# Patient Record
Sex: Male | Born: 1945 | Race: White | Hispanic: No | Marital: Married | Smoking: Former smoker
Health system: Southern US, Community
[De-identification: ages and names within clinical notes are randomized; demographics above are authoritative.]

## PROBLEM LIST (undated history)

## (undated) DIAGNOSIS — N189 Chronic kidney disease, unspecified: Secondary | ICD-10-CM

## (undated) DIAGNOSIS — E119 Type 2 diabetes mellitus without complications: Secondary | ICD-10-CM

## (undated) DIAGNOSIS — J90 Pleural effusion, not elsewhere classified: Secondary | ICD-10-CM

## (undated) DIAGNOSIS — I509 Heart failure, unspecified: Secondary | ICD-10-CM

## (undated) DIAGNOSIS — E669 Obesity, unspecified: Secondary | ICD-10-CM

## (undated) HISTORY — DX: Pleural effusion, not elsewhere classified: J90

## (undated) HISTORY — DX: Chronic kidney disease, unspecified: N18.9

## (undated) HISTORY — DX: Obesity, unspecified: E66.9

## (undated) HISTORY — PX: THORACENTESIS: SHX235

## (undated) HISTORY — DX: Heart failure, unspecified: I50.9

## (undated) HISTORY — DX: Type 2 diabetes mellitus without complications: E11.9

## (undated) HISTORY — PX: CORONARY ARTERY BYPASS GRAFT: SHX141

---

## 2009-05-03 ENCOUNTER — Encounter: Admission: RE | Admit: 2009-05-03 | Discharge: 2009-05-03 | Payer: Self-pay | Admitting: Internal Medicine

## 2009-09-24 ENCOUNTER — Encounter: Admission: RE | Admit: 2009-09-24 | Discharge: 2009-09-24 | Payer: Self-pay | Admitting: Internal Medicine

## 2010-06-18 ENCOUNTER — Other Ambulatory Visit: Payer: Self-pay | Admitting: Internal Medicine

## 2010-06-18 ENCOUNTER — Ambulatory Visit
Admission: RE | Admit: 2010-06-18 | Discharge: 2010-06-18 | Disposition: A | Discharge status: Home / Self Care | Payer: Medicare Other | Source: Ambulatory Visit | Attending: Internal Medicine | Admitting: Internal Medicine

## 2010-06-18 DIAGNOSIS — R05 Cough: Secondary | ICD-10-CM

## 2010-06-18 DIAGNOSIS — R059 Cough, unspecified: Secondary | ICD-10-CM

## 2011-05-05 ENCOUNTER — Encounter: Payer: Self-pay | Admitting: Cardiothoracic Surgery

## 2011-05-06 ENCOUNTER — Ambulatory Visit (INDEPENDENT_AMBULATORY_CARE_PROVIDER_SITE_OTHER): Payer: Commercial Indemnity | Admitting: Cardiothoracic Surgery

## 2011-05-06 DIAGNOSIS — J90 Pleural effusion, not elsewhere classified: Secondary | ICD-10-CM

## 2011-05-06 DIAGNOSIS — I509 Heart failure, unspecified: Secondary | ICD-10-CM

## 2011-05-06 DIAGNOSIS — E119 Type 2 diabetes mellitus without complications: Secondary | ICD-10-CM

## 2011-05-06 DIAGNOSIS — Z951 Presence of aortocoronary bypass graft: Secondary | ICD-10-CM

## 2011-05-06 NOTE — Progress Notes (Signed)
PCP is No primary provider on file. Referring Provider is Bethanie Dicker, MD                                                                     [address]            [place] [address]          [phone]      No chief complaint on file.   HPI: The patient is a 66 year old obese diabetic nonsmoker with a recurrent large right pleural effusion. He is required to thoracentesis procedures since February of this year each returning 2 L of clear fluid. Cytology is negative for malignancy and cultures are negative. Cytology shows mesenchymal cells and inflammation. The patient is referred for evaluation of a talc pleurodesis and Pleurx catheter placement. Is felt that the patient's recurrent right pleural effusion secondary to cor pulmonale and a diagnosis of sleep apnea. The patient is not able to tolerate a CPAP mask. His last echocardiogram in 6 months ago showed moderate portal hypertension, mild TR, mild MR, EF of 57%. 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 A circumflex graft was occluded to a small atretic native vessel by cardiac cath October 2012.  The patient's last thoracentesis was 6 days ago he can already feel that the fluid is returning and is becoming dyspneic with exertion. He denies any recent trauma to the chest. He denies any blood thinners other than aspirin. He denies any recent symptoms of upper SPARC or infection. There is no history of cirrhosis or heavy alcohol intake. He is followed by Dr. Heron Nay for his heart failure and has a pending evaluation by Dr. Chales Abrahams for a possible pacemaker for chronic bradycardia.   The patient was recently admitted to this hospital in February after an apneic near respiratory arrest during colonoscopy at which time the large right pleural effusion was noted and drained for the first time. CT scan of the chest shows no pulmonary mass or pathologic  mediastinal adenopathy. Past Medical History  Diagnosis Date  . Unspecified pleural effusion   . Obesity, unspecified   . Type II or unspecified type diabetes mellitus without mention of complication, not stated as uncontrolled   . CHF (congestive heart failure)     Past Surgical History  Procedure Date  . Coronary artery bypass graft     No family history on file.  Social History History  Substance Use Topics  . Smoking status: Former Smoker    Quit date: 08/20/2010  . Smokeless tobacco: Not on file  . Alcohol Use: Yes     OCCASIONAL   retired from the trucking industry 2009  Current Outpatient Prescriptions  Medication Sig Dispense Refill  . Aspirin (ECOTRIN PO) Take 325 mg by mouth daily.      Marland Kitchen atorvastatin (LIPITOR) 10 MG tablet Take 10 mg by mouth daily.      Marland Kitchen Bioflavonoid Products (ESTER-C) 500-550 MG TABS Take by mouth daily.      . FUROSEMIDE PO 20MG  TABLET, 1 ORAL AT 2:00PM DAILY 40MG  TABLET, 1 ORAL DAILY      . METOPROLOL TARTRATE PO Take 50 mg by  mouth 2 (two) times daily.      . multivitamin (METANX) 3-35-2 MG TABS tablet Take 1 tablet by mouth daily.        No Known Allergies  Review of Systems Constitutional fatigue with exertion no fever or night sweats HEENT no change in vision no dental complaints no difficulty swallowing Thorax recurrent right pleural effusion is noted no chest trauma positive for sleep apnea unable to tolerate CPAP mask Cardiac history CABG times 04/06/1997 last echo shows EF greater than 50% mild TR moderate pulmonary hypertension Abdominal negative for jaundice gallstones ulcer disease positive for hepatitis at age 36 now resolved Endocrine positive for diabetes mellitus treated with a insulin pump since 2009 Vascular no DVT claudication TIA Neurologic negative her stroke or seizure  There were no vitals taken for this visit. Physical Exam Blood pressure 128/70 pulse 50 temperature 90.5 saturation 93% weight 271 pounds height  72 inches Gen. Caucasian anxious obese male accompanied by wife HEENT normocephalic pupils equal dentition good Neck without JVD mass or bruit Lymphatics without palpable adenopathy the neck or supraclavicular fossa Cardiac regular rhythm without murmur well-healed sternal incision Pulmonary diminished breath sounds right base Abdomen soft obese nontender Extremities palpable radial pulses 3+ tibial ankle edema nonpalpable pulses do to edema no skin breakdown Neurologic alert without focal motor deficit  Diagnostic Tests: CT scan last cath last 2-D echo reviewed with the results noted above  Impression: Recurrent right pleural effusion secondary to right heart failure from probable cor pulmonale and sleep apnea compounded by ischemic heart disease and chronic bradycardia. No evidence of chronic pulmonary emboli by CTA.  Plan:Right VATS drainage of effusion, talc pleurodesed is in place and the Pleurx catheter scheduled for April 19. Procedure indications risks and alternatives discussed the patient wife and he understands and agrees to proceed. While he is in the hospital we'll asked Dr. Chales Abrahams 2 consult for evaluation of pacemaker.

## 2011-05-06 NOTE — Patient Instructions (Signed)
Nothing by mouth the night before surgery Do not take Q. her usual morning medications the morning of surgery Reduce the insulin pump to one half dose evening before surgery

## 2011-05-08 DIAGNOSIS — J90 Pleural effusion, not elsewhere classified: Secondary | ICD-10-CM

## 2011-05-20 ENCOUNTER — Ambulatory Visit (INDEPENDENT_AMBULATORY_CARE_PROVIDER_SITE_OTHER): Payer: Self-pay | Admitting: Cardiothoracic Surgery

## 2011-05-20 DIAGNOSIS — J90 Pleural effusion, not elsewhere classified: Secondary | ICD-10-CM

## 2011-05-20 DIAGNOSIS — I2789 Other specified pulmonary heart diseases: Secondary | ICD-10-CM

## 2011-05-20 DIAGNOSIS — I272 Pulmonary hypertension, unspecified: Secondary | ICD-10-CM

## 2011-05-20 DIAGNOSIS — Z951 Presence of aortocoronary bypass graft: Secondary | ICD-10-CM

## 2011-05-20 NOTE — Progress Notes (Signed)
PCP is No primary provider on file. Referring Provider is Bethanie Dicker, MD  No chief complaint on file.   HPI: Postop 10 days right vats and Pleurx catheter placement for recurrent right pleural effusion status post CABG 8 months ago, history of pulmonary hypertension         Doing well at home on home oxygen minimal Pleurx drainage by home health nurse 48 hours ago no fever productive cough weight has decreased 10 pounds from preop baseline ankle edema present   Past Medical History  Diagnosis Date  . Unspecified pleural effusion   . Obesity, unspecified   . Type II or unspecified type diabetes mellitus without mention of complication, not stated as uncontrolled   . CHF (congestive heart failure)     Past Surgical History  Procedure Date  . Coronary artery bypass graft     No family history on file.  Social History History  Substance Use Topics  . Smoking status: Former Smoker    Quit date: 08/20/2010  . Smokeless tobacco: Not on file  . Alcohol Use: Yes     OCCASIONAL    Current Outpatient Prescriptions  Medication Sig Dispense Refill  . Aspirin (ECOTRIN PO) Take 325 mg by mouth daily.      Marland Kitchen atorvastatin (LIPITOR) 10 MG tablet Take 10 mg by mouth daily.      Marland Kitchen Bioflavonoid Products (ESTER-C) 500-550 MG TABS Take by mouth daily.      . FUROSEMIDE PO 20MG  TABLET, 1 ORAL AT 2:00PM DAILY 40MG  TABLET, 1 ORAL DAILY      . METOPROLOL TARTRATE PO Take 50 mg by mouth 2 (two) times daily.      . multivitamin (METANX) 3-35-2 MG TABS tablet Take 1 tablet by mouth daily.        No Known Allergies  Review of Systems no fever drainage or bleeding, Pleurx catheter site clean and dry, home health nurse attending to Pleurx catheter  There were no vitals taken for this visit. Physical Exam Blood pressure 100/55 pulse 60 regular Breath sounds clear bilaterally VATS incisions clean and dry Pleurx catheter site clean and dry  Diagnostic Tests: Chest x-ray minimal right  pleural effusion, Pleurx catheter in good position  Impression: Status post right vats tall pleurodesis with good results so far Pleurx drain in the office today of 75 cc  Plan: Return in one week with chest x-ray to drain Pleurx catheter, hope to remove the Pleurx catheter near in future if pleural effusion does not recurr          Keflex 500 mg 3 times a day started for redness around old chest tube site         Home health nurse to drain Pleurx catheter on Monday, May 6

## 2011-05-27 ENCOUNTER — Ambulatory Visit (INDEPENDENT_AMBULATORY_CARE_PROVIDER_SITE_OTHER): Payer: Commercial Indemnity | Admitting: Cardiothoracic Surgery

## 2011-05-27 DIAGNOSIS — J90 Pleural effusion, not elsewhere classified: Secondary | ICD-10-CM

## 2011-05-27 DIAGNOSIS — Z9889 Other specified postprocedural states: Secondary | ICD-10-CM

## 2011-05-27 NOTE — Progress Notes (Signed)
PCP is No primary provider on file. Referring Provider is No ref. provider found  No chief complaint on file.   HPI: The patient returns for followup after undergoing right pleurodesed is with talc for recurrent benign pleural effusion. He also at the same time a Pleurx catheter placed. Since discharge from the hospital he has had minimal Pleurx drainage. His breathing is improved. The incisions are healing. His chest x-ray today shows mild right pleural effusion. He was having up to 2 L drain every 3 days.   Past Medical History  Diagnosis Date  . Unspecified pleural effusion   . Obesity, unspecified   . Type II or unspecified type diabetes mellitus without mention of complication, not stated as uncontrolled   . CHF (congestive heart failure)     Past Surgical History  Procedure Date  . Coronary artery bypass graft     No family history on file.  Social History History  Substance Use Topics  . Smoking status: Former Smoker    Quit date: 08/20/2010  . Smokeless tobacco: Not on file  . Alcohol Use: Yes     OCCASIONAL    Current Outpatient Prescriptions  Medication Sig Dispense Refill  . Aspirin (ECOTRIN PO) Take 325 mg by mouth daily.      Marland Kitchen atorvastatin (LIPITOR) 10 MG tablet Take 10 mg by mouth daily.      Marland Kitchen Bioflavonoid Products (ESTER-C) 500-550 MG TABS Take by mouth daily.      . FUROSEMIDE PO 20MG  TABLET, 1 ORAL AT 2:00PM DAILY 40MG  TABLET, 1 ORAL DAILY      . METOPROLOL TARTRATE PO Take 50 mg by mouth 2 (two) times daily.      . multivitamin (METANX) 3-35-2 MG TABS tablet Take 1 tablet by mouth daily.        No Known Allergies  Review of Systems negative  There were no vitals taken for this visit. Physical Exam Afebrile blood pressure stable pulse 90 saturation 95% Lungs clear  telescopic incisions clean and dry Pleurx catheter drained 10 cc  Diagnostic Tests:  chest x-ray with minimal right pleural effusion   Impression: Effective talc pleurodesed is  of right pleural space with minimal recurrent right pleural effusion following procedure. Plan on leaving th  Pleurx catheter in for 2 weeks with followup chest x-ray in the office in of no change plan on removing Pleurx catheter.

## 2011-06-10 ENCOUNTER — Ambulatory Visit (INDEPENDENT_AMBULATORY_CARE_PROVIDER_SITE_OTHER): Payer: Self-pay | Admitting: Cardiothoracic Surgery

## 2011-06-10 ENCOUNTER — Encounter (HOSPITAL_COMMUNITY): Payer: Self-pay | Admitting: Pharmacy Technician

## 2011-06-10 ENCOUNTER — Encounter: Payer: Self-pay | Admitting: Cardiothoracic Surgery

## 2011-06-10 ENCOUNTER — Other Ambulatory Visit: Payer: Self-pay | Admitting: Cardiothoracic Surgery

## 2011-06-10 ENCOUNTER — Ambulatory Visit
Admission: RE | Admit: 2011-06-10 | Discharge: 2011-06-10 | Disposition: A | Discharge status: Home / Self Care | Payer: Managed Care, Other (non HMO) | Source: Ambulatory Visit | Attending: Cardiothoracic Surgery | Admitting: Cardiothoracic Surgery

## 2011-06-10 VITALS — BP 128/80 | HR 96 | Resp 18 | Wt 254.0 lb

## 2011-06-10 DIAGNOSIS — J90 Pleural effusion, not elsewhere classified: Secondary | ICD-10-CM

## 2011-06-10 DIAGNOSIS — I509 Heart failure, unspecified: Secondary | ICD-10-CM

## 2011-06-10 DIAGNOSIS — Z09 Encounter for follow-up examination after completed treatment for conditions other than malignant neoplasm: Secondary | ICD-10-CM

## 2011-06-10 NOTE — Progress Notes (Signed)
PCP is Ralene Ok, MD, MD Referring Provider is Bethanie Dicker, MD  Chief Complaint  Patient presents with  . Routine Post Op    2 week F/U with CXR, Rt PleurX cath                     [address]            [place] [address]          [phone]      HPI: The patient is a 66 year old gentleman with right-sided heart failure from pulmonary hypertension with recurrent right pleural effusion treated with right VATS, talc pleurodesis and placement of a Pleurx catheter. Over the past month the Pleurx catheter drainage has been minimal. His breathing is been improved. He is off home oxygen except at night. Remains on the same dose of Lasix 60 mg orally each day. Pleurx catheter is draining today in the office and there is trace drainage. The chest x-ray taken today shows small right loculated pleural effusion. The patient is breathing comfortably. We will plan on removing the Pleurx catheter in the outpatient procedure area.  Past Medical History  Diagnosis Date  . Unspecified pleural effusion   . Obesity, unspecified   . Type II or unspecified type diabetes mellitus without mention of complication, not stated as uncontrolled   . CHF (congestive heart failure)     Past Surgical History  Procedure Date  . Coronary artery bypass graft    . Thoracentesis     right pleural effusion    No family history on file.  Social History History  Substance Use Topics  . Smoking status: Former Smoker    Quit date: 08/20/2010  . Smokeless tobacco: Not on file  . Alcohol Use: Yes     OCCASIONAL    Current Outpatient Prescriptions  Medication Sig Dispense Refill  . Aspirin (ECOTRIN PO) Take 325 mg by mouth daily.      Marland Kitchen atorvastatin (LIPITOR) 10 MG tablet Take 10 mg by mouth daily.      Marland Kitchen Bioflavonoid Products (ESTER-C) 500-550 MG TABS Take by mouth daily.      . FUROSEMIDE PO 20MG  TABLET, 1 ORAL AT 2:00PM DAILY 40MG  TABLET, 1 ORAL DAILY      . insulin aspart (NOVOLOG)  100 UNIT/ML injection Inject into the skin See admin instructions. Insulin pump      . multivitamin (METANX) 3-35-2 MG TABS tablet Take 1 tablet by mouth daily.      . valsartan-hydrochlorothiazide (DIOVAN-HCT) 320-25 MG per tablet Take 1 tablet by mouth daily.       Marland Kitchen amiodarone (PACERONE) 200 MG tablet Take 200 mg by mouth daily.         No Known Allergies  Review of Systems patient states he is having some exertional anterior chest tightness with exertion. His last cardiac cath was 6 months ago showing 2 patent grafts and 1 occluded graft. He was advised to discuss this with his cardiologist Dr. Heron Nay at [facility]  BP 128/80  Pulse 96  Resp 18  Wt 254 lb (115.214 kg)  SpO2 94% Physical Exam Chest incision is well-healed Breath sounds clear and equal Cardiac rhythm regular Baseline tibial edema no change  Diagnostic Tests: Chest x-ray with right small pleural effusion Rhythm strip shows no evidence of atrial fibrillation with a paced rhythm  Impression: Response to talc pleurodesed is without evidence of significant recurrence of pleural effusion. We'll remove the Pleurx catheter.  Plan:  Stop the amiodarone now. Pleurx catheter removal scheduled for [facility] 24 at [facility].

## 2011-06-11 ENCOUNTER — Other Ambulatory Visit: Payer: Self-pay

## 2011-06-11 DIAGNOSIS — J90 Pleural effusion, not elsewhere classified: Secondary | ICD-10-CM

## 2011-06-12 ENCOUNTER — Encounter (HOSPITAL_COMMUNITY)
Admission: RE | Disposition: A | Discharge status: Home / Self Care | Payer: Self-pay | Source: Ambulatory Visit | Attending: Cardiothoracic Surgery

## 2011-06-12 ENCOUNTER — Ambulatory Visit (HOSPITAL_COMMUNITY)
Admission: RE | Admit: 2011-06-12 | Discharge: 2011-06-12 | Disposition: A | Discharge status: Home / Self Care | Payer: Managed Care, Other (non HMO) | Source: Ambulatory Visit | Attending: Cardiothoracic Surgery | Admitting: Cardiothoracic Surgery

## 2011-06-12 ENCOUNTER — Encounter (HOSPITAL_COMMUNITY): Payer: Self-pay | Admitting: *Deleted

## 2011-06-12 DIAGNOSIS — J90 Pleural effusion, not elsewhere classified: Secondary | ICD-10-CM

## 2011-06-12 DIAGNOSIS — Z4682 Encounter for fitting and adjustment of non-vascular catheter: Secondary | ICD-10-CM | POA: Insufficient documentation

## 2011-06-12 HISTORY — PX: REMOVAL OF PLEURAL DRAINAGE CATHETER: SHX5080

## 2011-06-12 SURGERY — REMOVAL, CLOSED DRAINAGE CATHETER SYSTEM, PLEURAL
Anesthesia: LOCAL | Site: Chest | Laterality: Right | Wound class: Clean Contaminated

## 2011-06-12 MED ORDER — LIDOCAINE HCL (PF) 1 % IJ SOLN
INTRAMUSCULAR | Status: AC
  Filled 2011-06-12: qty 5

## 2011-06-12 NOTE — Op Note (Signed)
NAME:  Jeremy Houston, Jeremy Houston NO.:  [id number]  MEDICAL RECORD NO.:  [id number]  LOCATION:  [facility]                         FACILITY:  [facility]  PHYSICIAN:  Kerin Perna, M.D.  DATE OF BIRTH:  [birth year]  DATE OF PROCEDURE: DATE OF DISCHARGE:  06/12/2011                              OPERATIVE REPORT   OPERATION:  Removal of right PleurX catheter.  PREOPERATIVE DIAGNOSIS:  Status post right PleurX catheter five weeks ago for recurrent right pleural effusion, (nonmalignant).  POSTOPERATIVE DIAGNOSIS:  Status post right PleurX catheter five weeks ago for recurrent right pleural effusion, (nonmalignant).  SURGEON:  Kerin Perna, MD.  ANESTHESIA:  Local 1% lidocaine.  INDICATIONS:  The patient had a PleurX catheter for recurrent right pleural effusion requiring thoracentesis on multiple occasions.  It was felt that the effusion was secondary to diastolic heart failure.  The patient 5 weeks ago underwent right VATS talc pleurodesis and placement of a PleurX catheter.  Postoperatively, the pleural effusion has not had a significant recurrence and a PleurX catheter had scant drainage, so a PleurX catheter was scheduled to be removed.  The patient understood this procedure and agreed.  PROCEDURAL NOTE:  The patient was brought to the [facility] and a consent for right PleurX catheter was obtained.  The right chest was prepped and draped as a sterile field.  Local 1% lidocaine was infiltrated at the exit site.  Hemostat gently freed the cuff from the surrounding subcutaneous tissue.  The catheter was then removed in its entirety.  The site was closed with two 3-0 nylon sutures and a dressing was applied.  The patient had no known complications.  He will be checked in the office for suture removal in 1 week.     Kerin Perna, M.D.     PV/MEDQ  D:  06/12/2011  T:  06/12/2011  Job:  [id number]  cc:   Bethanie Dicker

## 2011-06-12 NOTE — Discharge Instructions (Signed)
Instructed to remove dsg on Sunday and return to office in one week to have stitches removed.

## 2011-06-12 NOTE — Brief Op Note (Signed)
06/12/2011  1:48 PM  PATIENT:  Jeremy Houston  66 y.o. male  PRE-OPERATIVE DIAGNOSIS:  pleural effusion  POST-OPERATIVE DIAGNOSIS:  S/p R pleurex cath  PROCEDURE:  Procedure(s) (LRB): REMOVAL OF PLEURAL DRAINAGE CATHETER (Right)  SURGEON:  Surgeon(s) and Role:    * Kerin Perna, MD - Primary  PHYSICIAN ASSISTANT: 0  ASSISTANTS: none   ANESTHESIA:   Local 1% lidocaine  EBL:   0  BLOOD ADMINISTERED:0  DRAINS: 0   LOCAL MEDICATIONS USED:lidocaine  SPECIMEN:  0  DISPOSITION OF SPECIMEN:  0  COUNTS:  0  TOURNIQUET:  0  DICTATION: .done  PLAN OF CARE: Discharge to home after PACU  PATIENT DISPOSITION:  home   Delay start of Pharmacological VTE agent (>24hrs) due to surgical blood loss or risk of bleeding:0

## 2011-06-13 ENCOUNTER — Encounter (HOSPITAL_COMMUNITY): Payer: Self-pay

## 2011-06-16 ENCOUNTER — Encounter (HOSPITAL_COMMUNITY): Payer: Self-pay | Admitting: Cardiothoracic Surgery

## 2011-06-22 ENCOUNTER — Ambulatory Visit (INDEPENDENT_AMBULATORY_CARE_PROVIDER_SITE_OTHER): Payer: Self-pay | Admitting: Physician Assistant

## 2011-06-22 VITALS — BP 124/70 | HR 91 | Resp 18 | Ht 71.0 in | Wt 254.0 lb

## 2011-06-22 DIAGNOSIS — Z09 Encounter for follow-up examination after completed treatment for conditions other than malignant neoplasm: Secondary | ICD-10-CM

## 2011-06-22 DIAGNOSIS — J90 Pleural effusion, not elsewhere classified: Secondary | ICD-10-CM

## 2011-06-22 NOTE — Progress Notes (Signed)
  HPI:  Patient returns for routine postoperative follow-up having undergone removal of a right pleurex catheter by Dr. Donata Clay on 06/12/2011. The patient reports no problems or complaints. He denies shortness of breath or chest pain.   Current Outpatient Prescriptions  Medication Sig Dispense Refill  . Aspirin (ECOTRIN PO) Take 325 mg by mouth daily.      Marland Kitchen atorvastatin (LIPITOR) 10 MG tablet Take 10 mg by mouth daily.      Marland Kitchen Bioflavonoid Products (ESTER-C) 500-550 MG TABS Take 1 tablet by mouth daily.       . furosemide (LASIX) 20 MG tablet Take 20-40 mg by mouth 2 (two) times daily. 40mg  in AM and 20 in PM      . insulin aspart (NOVOLOG) 100 UNIT/ML injection Inject into the skin continuous. Via insulin pump      . multivitamin (METANX) 3-35-2 MG TABS tablet Take 1 tablet by mouth daily.      . valsartan-hydrochlorothiazide (DIOVAN-HCT) 320-25 MG per tablet Take 1 tablet by mouth daily.       Vital Signs: Blood pressure 124/70, HR 91, RR 18, and O2 sat 94% on room air.  Physical Exam: Cardiovascular:RRR. Pulmonary:Clear to auscultation on left and right apex; slightly diminished at right base. Wound:Clean and dry.  Impression and Plan: 2 silk sutures were removed without difficulty.He will return on an as needed basis.

## 2012-10-18 ENCOUNTER — Encounter: Payer: Managed Care, Other (non HMO) | Attending: Internal Medicine | Admitting: *Deleted

## 2012-10-18 VITALS — Ht 72.0 in | Wt 287.5 lb

## 2012-10-18 DIAGNOSIS — E119 Type 2 diabetes mellitus without complications: Secondary | ICD-10-CM | POA: Insufficient documentation

## 2012-10-18 DIAGNOSIS — Z713 Dietary counseling and surveillance: Secondary | ICD-10-CM | POA: Insufficient documentation

## 2012-10-18 NOTE — Patient Instructions (Addendum)
Plan: Continue using your Bolus Wizard for your insulin pump Start checking your BG every AM even if you don't eat breakfast, and give a Correction Dose if BG is too high We have increased your Basal Rate by 10% from 1.80 to 2.00 units per hour for total of 48 units per day from your Basal Rate Consider checking into the [employer] and see what classes or types of activities you would be interested in doing.

## 2012-10-18 NOTE — Progress Notes (Signed)
Appt start time: 1500 end time:  1600.  Assessment:  Patient was seen on  10/18/12 for individual diabetes education. He has history of DM2 for about 20 years and is currently on Medtronic Revel insulin pump. He states he SMBG 3 times a day with current reported range of 150-300 mg/dl pre and post meals. He states no recent hypoglycemia. He feels he eats appropriately most of the time, but is not willing to restrict food if it gets in the way of social times with family or friends. He is not exercising at this time.  Current HbA1c: 8.7%  MEDICATIONS: see list. Diabetes medications are Apidra in pump and recently Victoza has been added   DIETARY INTAKE:  Usual eating pattern includes 2-3 meals and 0-1 snacks per day.  Everyday foods include easily prepared foods.  Avoided foods include regular soda, usually avoids desserts.    24-hr recall:  B ( AM): skips most days, OR cereal with milk OR regular oatmeal with fresh fruit,  Snk ( AM): none  L ( PM): pimiento loaf, cole slaw, potato salad, sliced tomato OR low calorie bread sandwich, diet Coke Snk ( PM): none D ( PM): meat, starch and vegetable meal OR soup meal with a few crackers, diet soda Snk ( PM): not usually unless peanuts Beverages: diet soda  Usual physical activity: none at this time  Estimated energy needs: 1600 calories 180 g carbohydrates 120 g protein 44 g fat  Progress Towards Goal(s):  In progress.   Nutritional Diagnosis:  NB-1.1 Food and nutrition-related knowledge deficit As related to diabetes control.  As evidenced by A1c of 8.7%.    Intervention:   Used this visit to upload pump to [employer] and evaluate pump use and BG patterns. Patient states his MD is comfortable with him making pump adjustments on his own. Based on excessive hyperglycemia, I suggested he increase his Basal Rate by 10% from 1.80 to 2.00 units per hour. He agreed and change was made during the visit. I anticipate further changes will  be needed, patient agrees to come back for next upload within 2 weeks to continue with evaluation of BG and further pump setting changes as needed. He is not comfortable uploading from home at this time. Also discusses significant benefit of exercise and he is willing to follow up with [employer] near his home.  Plan: Continue using your Bolus Wizard for your insulin pump Start checking your BG every AM even if you don't eat breakfast, and give a Correction Dose if BG is too high We have increased your Basal Rate by 10% from 1.80 to 2.00 units per hour for total of 48 units per day from your Basal Rate Consider checking into the [employer] and see what classes or types of activities you would be interested in doing.  Handouts given during visit include: Living Well with Diabetes Carb Counting and Food Label handouts Meal Plan Card Insulin Pump handout [employer] [facility] Reports  Barriers to learning/adherance to lifestyle change: limited willingness to change behaviors at this point in his life  Diabetes self-care support plan:   [facility] support group  Ongoing diabetes education and insulin pump fine tuning.  Monitoring/Evaluation:  Dietary intake, exercise, SMBG, and body weight in 2 week(s).

## 2012-10-19 ENCOUNTER — Encounter: Payer: Self-pay | Admitting: *Deleted

## 2012-11-06 IMAGING — CR DG CHEST 2V
3 series · 3 of 3 positions shown · non-contrast
Comparison: None.

CLINICAL DATA: Cough for 3 weeks with shortness of breath.

CHEST - 2 VIEW

[view not recorded (1 of 3)]
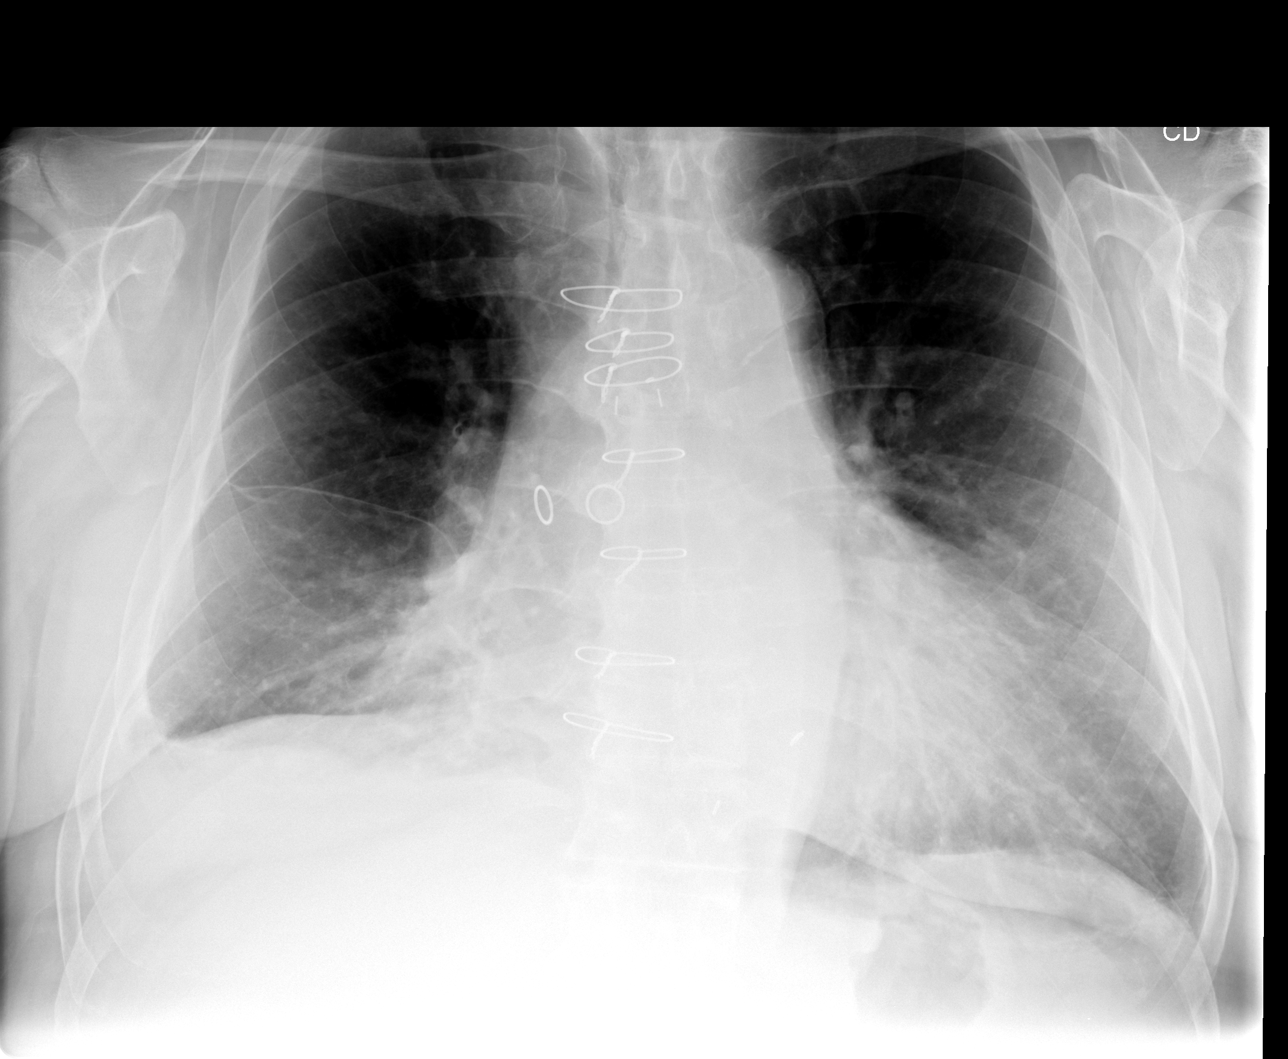

[view not recorded (2 of 3)]
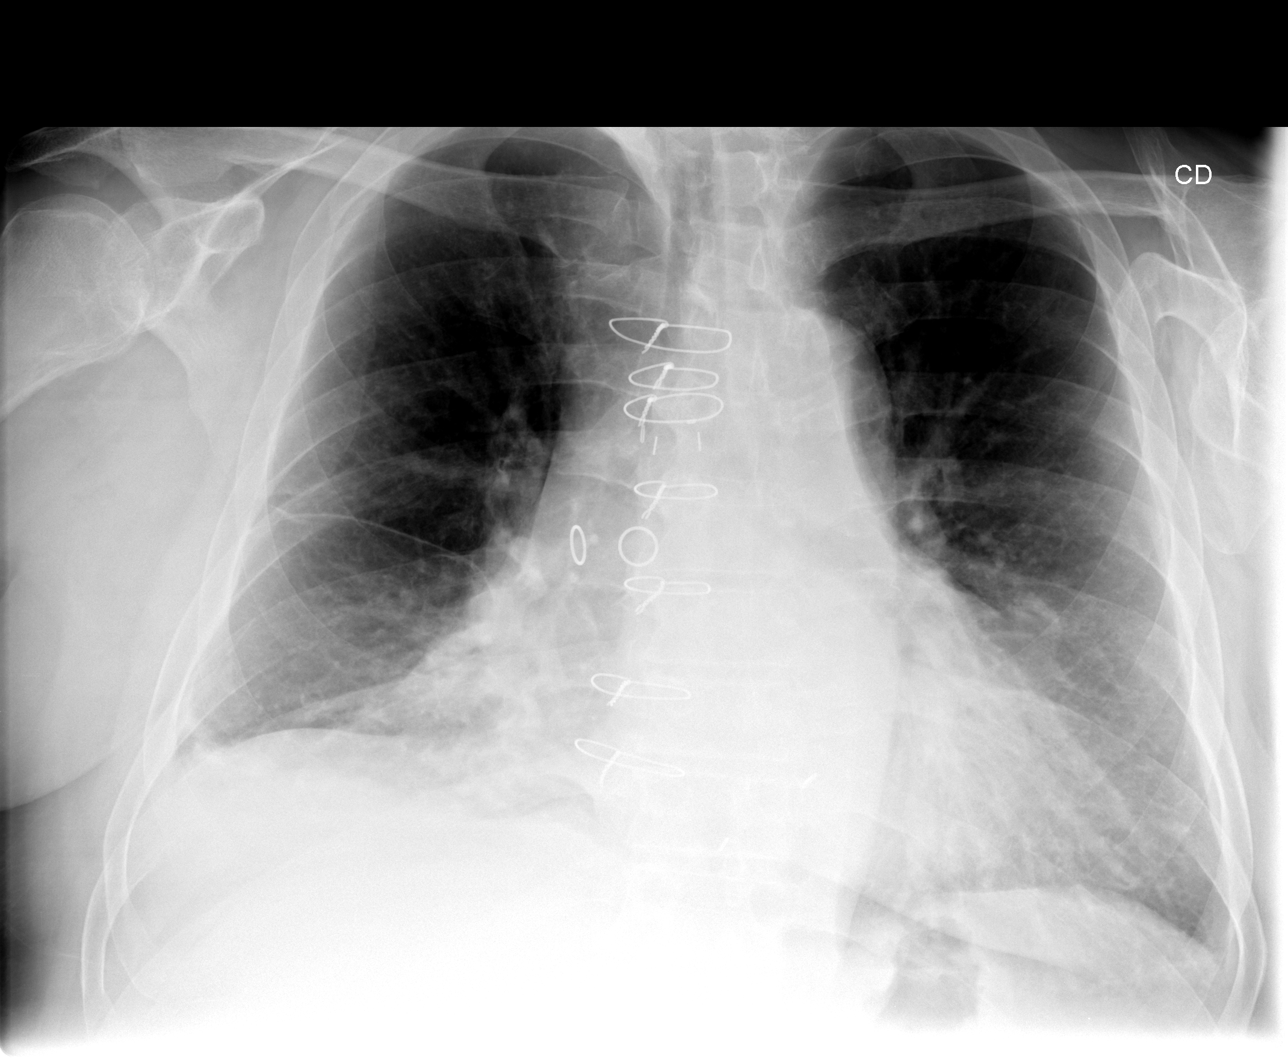

[view not recorded (3 of 3)]
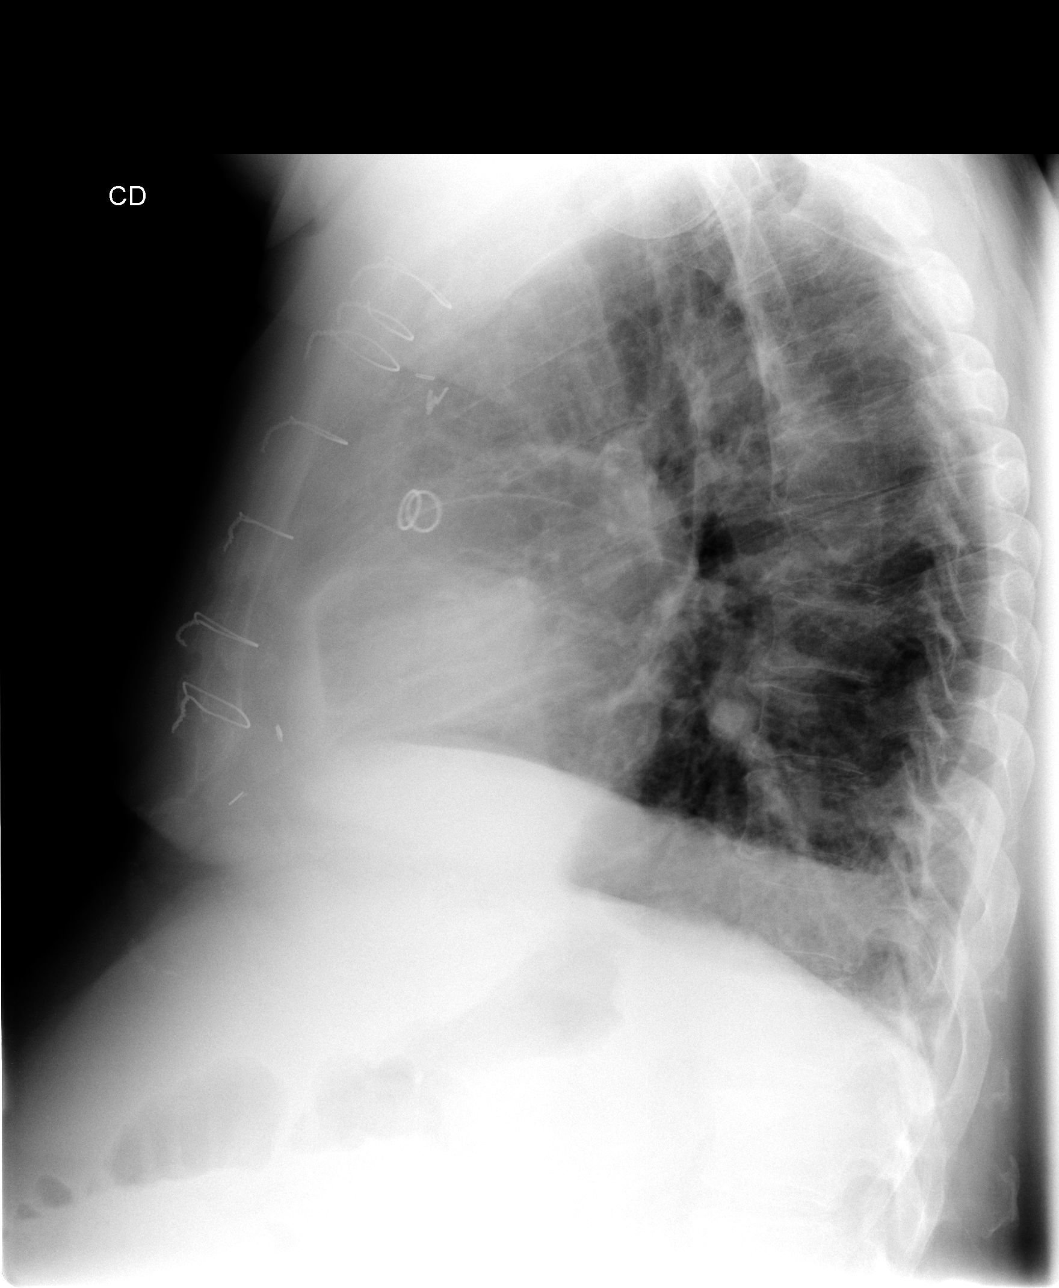

[3 of 3 positions shown; findings below may reference images not displayed]

FINDINGS: There is moderate cardiac enlargement status post CABG.
There is vascular congestion without overt pulmonary edema.  There
is no confluent airspace opacity.  Blunting of the right
costophrenic angle may reflect a small loculated pleural effusion
or pleural thickening.  There is no pleural fluid on the left.
There is no pneumothorax.  No acute osseous findings are
identified.
IMPRESSION: Cardiomegaly with vascular congestion.  Small loculated right
pleural effusion versus pleural thickening.

## 2012-11-17 ENCOUNTER — Encounter: Payer: Managed Care, Other (non HMO) | Attending: Internal Medicine | Admitting: *Deleted

## 2012-11-17 VITALS — Ht 72.0 in | Wt 282.2 lb

## 2012-11-17 DIAGNOSIS — IMO0001 Reserved for inherently not codable concepts without codable children: Secondary | ICD-10-CM

## 2012-11-17 DIAGNOSIS — E119 Type 2 diabetes mellitus without complications: Secondary | ICD-10-CM | POA: Insufficient documentation

## 2012-11-17 DIAGNOSIS — Z713 Dietary counseling and surveillance: Secondary | ICD-10-CM | POA: Insufficient documentation

## 2012-11-17 NOTE — Progress Notes (Signed)
Appt start time: 1630 end time:  1700.  Assessment:  Patient was seen on  11/17/12 for individual diabetes education follow up visit. He has started checking his BG in AM and giving the Correction Dose as I requested. He states he has had Carb Counting instruction several times but upon questioning he is not accurate in his ability. His average BG based on the CareLink Pro Report continues to be 261 mg/dl so I plan to make pump setting changes again at this visit. He is concerned about his healthcare coverage as he is in the process of updating his policies for next year. He has not pursued the [employer] due to these financial concerns although he may get some financial support for the [facility] with the new coverage he is considering.  Current HbA1c: 8.7%  MEDICATIONS: see list. Diabetes medications are Apidra in pump and recently Victoza has been added   DIETARY INTAKE:  Usual eating pattern includes 2-3 meals and 0-1 snacks per day.  Everyday foods include easily prepared foods.  Avoided foods include regular soda, usually avoids desserts.    24-hr recall:  B ( AM): skips most days, OR cereal with milk OR regular oatmeal with fresh fruit,  Snk ( AM): none  L ( PM): pimiento loaf, cole slaw, potato salad, sliced tomato OR low calorie bread sandwich, diet Coke Snk ( PM): none D ( PM): meat, starch and vegetable meal OR soup meal with a few crackers, diet soda Snk ( PM): not usually unless peanuts Beverages: diet soda  Usual physical activity: none at this time  Estimated energy needs: 1600 calories 180 g carbohydrates 120 g protein 44 g fat  Progress Towards Goal(s):  In progress.   Nutritional Diagnosis:  NB-1.1 Food and nutrition-related knowledge deficit As related to diabetes control.  As evidenced by A1c of 8.7%.    Intervention: Made pump adjustments as follows:   Pump Settings: Date: Current Date: 11/17/12   Basal Rate: Carb Ratio Sensitivity  Basal Rate: Carb Ratio Sensitivity    MN: 2.00 1/6 1/20 MN: 2.20  (+) 1/5 (+) 1/16 (+)                                                         Comments: Based on continued hyperglycemia, I suggested he increase his Basal Rate by 10%  from 2.00 to 2.20 units per hour. Also suggest increase in Carb Ratio based on post meal BGs going up more than 40 mg/dl and Sensitivity Factor increased as Correction boluses not bringing him down to target ranges. He agreed and changes were made during the visit. Also reviewed Carb Counting based on food group concept which he appeared to understand and was able to express learning through [employer]. Informed him that his Total Daily Dose will be increasing and he will need 4 vials of insulin per month. He plans to request update in his Rx from his MD.  Plan: Continue using your Bolus Wizard for your insulin pump Continue checking your BG every AM even if you don't eat breakfast, and giving a Correction Dose if BG is too high We have increased your Basal Rate by 10% from 2.00 to 2.20 units per hour for total of 52.8 units per day from your Basal Rate We have increased your Carb Ratio from  1 units / 6.0 grams  to 1 unit / 5.0 grams to improve your post meal BGs We have increased your Sensitivity Factor from 1 units / 20 mg/dl  to 1 unit /19 mg/dl to improve your correction of high BGs Consider checking into the [employer] and see what classes or types of activities you would be interested in doing. Consider each serving of Starch, Fruit and Milk as a Carb choice which has about 15 grams of carbohydrate each. Ask your Dr. About increasing the Rx for insulin as your total daily dose is now about 110 units / day = 3300 per month or more.   Handouts given during visit include: Carb Counting  Meal Plan Card CareLink Pro Reports  Barriers to learning/adherance to lifestyle change: he is expressing more willingness to pursue further diabetes education   Diabetes self-care support plan:   [facility]  support group  Ongoing diabetes education and insulin pump fine tuning.  Monitoring/Evaluation:  Dietary intake, exercise, SMBG, and body weight in 4 week(s).

## 2012-11-17 NOTE — Patient Instructions (Signed)
Plan: Continue using your Bolus Wizard for your insulin pump Continue checking your BG every AM even if you don't eat breakfast, and give a Correction Dose if BG is too high We have increased your Basal Rate by 10% from 2.00 to 2.20units per hour for total of 52.8 units per day from your Basal Rate We have increased your Carb Ratio from 1 units / 6.0 grams  to 1 unit / 5.0 grams to improve your post meal BGs We have increased your Sensitivity Factor from 1 units / 20 mg/dl  to 1 unit /16 mg/dl to improve your correction of high BGs Consider checking into the [employer] and see what classes or types of activities you would be interested in doing. Consider each serving of Starch, Fruit and Milk as a Carb choice which has about 15 grams of carbohydrate each. Ask your Dr. About increasing the Rx for insulin as your total daily dose is now about 110 units / day = 3300 per month or more.

## 2013-01-04 ENCOUNTER — Encounter: Payer: Managed Care, Other (non HMO) | Attending: Internal Medicine | Admitting: *Deleted

## 2013-01-04 DIAGNOSIS — E119 Type 2 diabetes mellitus without complications: Secondary | ICD-10-CM | POA: Insufficient documentation

## 2013-01-04 DIAGNOSIS — Z713 Dietary counseling and surveillance: Secondary | ICD-10-CM | POA: Insufficient documentation

## 2013-01-04 NOTE — Progress Notes (Signed)
Appt start time: 1630 end time:  1700.  Assessment:  Patient was seen on  01/04/13 for individual diabetes education follow up visit. We reviewed his Carelink Pro reports together. He continues to check his BG in AM and give a correction bolus as needed. Per report, his average BG has improved from 261 +/- 82 to 240 mg/dl +/- 55 so the average has decreased and the variability is improved. He has not been able to join the [facility] due to not being able to afford right now.   Current HbA1c: 8.7%  MEDICATIONS: see list. Diabetes medications are Apidra in pump and recently Victoza has been added   DIETARY INTAKE:  Usual eating pattern includes 2-3 meals and 0-1 snacks per day.  Everyday foods include easily prepared foods.  Avoided foods include regular soda, usually avoids desserts.    24-hr recall:  B ( AM): skips most days, OR cereal with milk OR regular oatmeal with fresh fruit,  Snk ( AM): none  L ( PM): pimiento loaf, cole slaw, potato salad, sliced tomato OR low calorie bread sandwich, diet Coke Snk ( PM): none D ( PM): meat, starch and vegetable meal OR soup meal with a few crackers, diet soda Snk ( PM): not usually unless peanuts Beverages: diet soda  Usual physical activity: none at this time  Estimated energy needs: 1600 calories 180 g carbohydrates 120 g protein 44 g fat  Progress Towards Goal(s):  In progress.   Nutritional Diagnosis:  NB-1.1 Food and nutrition-related knowledge deficit As related to diabetes control.  As evidenced by A1c of 8.7%.    Intervention: Made pump adjustments as follows:   Pump Settings: Date: Current Date: 11/17/12   Basal Rate: Carb Ratio Sensitivity  Basal Rate: Carb Ratio Sensitivity   MN: 2.15 1/5 1/16 MN: 2.35  (+) MN:1/5  1/16         6 PM: 1/4 (+)                                                 Comments: Based on continued hyperglycemia, I suggested he increase his Basal Rate by 10%  from 2.15 to 2.35 units per hour. Also  suggest increase in Carb Ratio at supper meal based on post meal BGs going up more than 40 mg/dl. He agreed and changes were made during the visit. Patient doing better with Carb Counting based on food group concept. Informed him that his Total Daily Dose will be increasing and he will need 4 vials of insulin per month. He plans to request update in his Rx from his MD.  Plan: Continue using your Bolus Wizard for your insulin pump Continue checking your BG every AM even if you don't eat breakfast, and give a Correction Dose if BG is too high We have increased your Basal Rate by 10% from 2.20 to 2.35units per hour for total of 56.4 units per day from your Basal Rate We have increased your Carb Ratio at supperfrom 1 units / 5.0 grams  to 1 unit / 4.0 grams to improve your post meal BGs after supper Consider each serving of Starch, Fruit and Milk as a Carb choice which has about 15 grams of carbohydrate each. Ask your Dr. About increasing the Rx for insulin as your total daily dose is now about 126 units / day = 3800 per  month or more.   Handouts given during visit include: CareLink Pro Reports  Barriers to learning/adherance to lifestyle change: he is expressing more willingness to pursue further diabetes education   Diabetes self-care support plan:   [facility] support group  Ongoing diabetes education and insulin pump fine tuning.  Monitoring/Evaluation:  Dietary intake, exercise, SMBG, and body weight in 8 week(s).

## 2013-01-04 NOTE — Patient Instructions (Signed)
Plan: Continue using your Bolus Wizard for your insulin pump Continue checking your BG every AM even if you don't eat breakfast, and give a Correction Dose if BG is too high We have increased your Basal Rate by 10% from 2.20 to 2.35units per hour for total of 56.4 units per day from your Basal Rate We have increased your Carb Ratio at supperfrom 1 units / 5.0 grams  to 1 unit / 4.0 grams to improve your post meal BGs after supper Consider each serving of Starch, Fruit and Milk as a Carb choice which has about 15 grams of carbohydrate each. Ask your Dr. About increasing the Rx for insulin as your total daily dose is now about 126 units / day = 3800 per month or more.

## 2013-03-07 ENCOUNTER — Ambulatory Visit: Payer: Managed Care, Other (non HMO) | Admitting: *Deleted

## 2013-03-28 ENCOUNTER — Encounter: Payer: Medicare Other | Attending: Internal Medicine | Admitting: *Deleted

## 2013-03-28 DIAGNOSIS — Z713 Dietary counseling and surveillance: Secondary | ICD-10-CM | POA: Insufficient documentation

## 2013-03-28 DIAGNOSIS — IMO0001 Reserved for inherently not codable concepts without codable children: Secondary | ICD-10-CM

## 2013-03-28 DIAGNOSIS — E1165 Type 2 diabetes mellitus with hyperglycemia: Secondary | ICD-10-CM

## 2013-03-28 DIAGNOSIS — E119 Type 2 diabetes mellitus without complications: Secondary | ICD-10-CM | POA: Insufficient documentation

## 2013-03-28 NOTE — Patient Instructions (Addendum)
Plan: Continue with your current pump settings, your average BG has dropped from 240 mg/dl down to 161168 mg/dl! Consider giving yourself a correction dose if your BG is high in the middle of the day and you aren't eating a meal at that time. Consider looking into Arm Chair Exercises (you can Google them on the Internet) that you can do during commercials when you can't get to the gym.

## 2013-03-28 NOTE — Progress Notes (Signed)
Appt start time: 1700 end time:  1730.  Assessment:  Patient was seen on  03/28/13 for individual diabetes education follow up visit. He is here with his wife, Tobi Bastosnna who appears supportive. He is frustrated with the change in his insurance coverage now that his wife has retired and he no longer is on her insurance. He is willing to upload his pump to [facility]Link Pro so we can evaluate his BG management.  He states he is eating less often, usually twice a day and is sleeping later in the mornings. He is concerned about cost of medications and insulin so he is trying to limit his insulin usage to save some money. He states he has joined the gym but hasn't started going yet, he would prefer not to go by himself.   Current HbA1c: 8.7%  MEDICATIONS: see list. Diabetes medications are Apidra in pump and recently Victoza has been added   DIETARY INTAKE:  Usual eating pattern includes 2-3 meals and 0-1 snacks per day.  Everyday foods include easily prepared foods.  Avoided foods include regular soda, usually avoids desserts.    24-hr recall:  B ( AM): cereal with milk OR regular oatmeal with fresh fruit,  Snk ( AM): none  L ( PM): skipping now Snk ( PM): none D ( PM): meat, starch and vegetable meal OR soup meal with a few crackers, diet soda Snk ( PM): not usually unless peanuts Beverages: diet soda  Usual physical activity: none at this time  Estimated energy needs: 1600 calories 180 g carbohydrates 120 g protein 44 g fat     Intervention: Reviewed [facility] Pro Reports and they indicate a significant drop in his average BG from 240 mg/dl down to 629168 mg/dl! His total insulin dose per day has also dropped from 115 to 95 units per day! We discussed the rationale of checking his BG in the middle of the day even if he is not eating lunch so he can offer a correction dose if BG is too high. Also suggested ways to increase his activity level including Arm Chair Exericses that he can find on the  Internet. Suggested he do them during any commercials while he is watching a movie, etc. Based on his concern of cost of medications, I suggested he find out which insulin his insurance covers at the best cost. As long as it is an Analog insulin, it can be used in the pump and there may be a less expensive choice as long as his MD is OK with it.   Pump Settings: Date: Current Date: 03/28/13  No changes made today   Basal Rate: Carb Ratio Sensitivity  Basal Rate: Carb Ratio Sensitivity   MN: 2.35 MN: 1/5 1/16 MN: 2.35   MN:1/5  1/16     6 PM: 1/4    6 PM: 1/4                                                  Comments: He is very pleased with the improved control. We made no changes to his pump settings today. I did suggest he start giving correction dose at time of any skipped meals to help bring the average BG down a little more.   Plan: Continue with your current pump settings, your average BG has dropped from 240 mg/dl down to 528168 mg/dl!  Consider giving yourself a correction dose if your BG is high in the middle of the day and you aren't eating a meal at that time. Consider looking into Arm Chair Exercises (you can Google them on the Internet) that you can do during commercials when you can't get to the gym.   Handouts given during visit include: CareLink Pro Reports  Barriers to learning/adherance to lifestyle change: he is concerned about cost of these visits. I explained Medicare coverage is limited to 2 hours per year so we plan to spread our visits out accordingly.  Diabetes self-care support plan:   [facility] support group  Ongoing diabetes education and insulin pump fine tuning in 4 month increments..  Monitoring/Evaluation:  Dietary intake, exercise, SMBG, and body weight in 4 months.

## 2013-08-03 ENCOUNTER — Encounter: Payer: Medicare Other | Attending: Internal Medicine | Admitting: *Deleted

## 2013-08-03 VITALS — Ht 72.0 in | Wt 283.4 lb

## 2013-08-03 DIAGNOSIS — Z713 Dietary counseling and surveillance: Secondary | ICD-10-CM | POA: Insufficient documentation

## 2013-08-03 DIAGNOSIS — Z794 Long term (current) use of insulin: Secondary | ICD-10-CM | POA: Diagnosis not present

## 2013-08-03 DIAGNOSIS — E119 Type 2 diabetes mellitus without complications: Secondary | ICD-10-CM | POA: Insufficient documentation

## 2013-08-03 NOTE — Patient Instructions (Signed)
Plan: Use 30 grams for a smaller carb meal and 60 grams for a larger meal Consider giving yourself a correction dose if your BG is high in the middle of the day and you aren't eating a meal at that time. We adjusted your pump settings today for a Max Bolus of 20 units and the Cannula fill is 0.5 units.

## 2013-08-03 NOTE — Progress Notes (Signed)
Appt start time: 1630 end time:  1700.  Assessment:  Patient was seen on  08/03/13 for individual diabetes education follow up visit. He has just received a replacement pump and he has programed his settings into it, but he would like them verified today. We uploaded his pump to [facility]Link Pro today to review his settings as well as his BG patterns and evaluate his use of the pump. He is not comfortable with Carb Counting so he puts the same amount of Carb grams for every meal @ 168 grams each.   Current HbA1c: 8.7%  MEDICATIONS: see list. Diabetes medications are Apidra in pump and recently Victoza has been added   DIETARY INTAKE:  Usual eating pattern includes 2-3 meals and 0-1 snacks per day.  Everyday foods include easily prepared foods.  Avoided foods include regular soda, usually avoids desserts.    24-hr recall:  B ( AM): cereal with milk OR regular oatmeal with fresh fruit, usually 8 oz OJ and 8 oz Skim milk Snk ( AM): none  L ( PM): sandwich Snk ( PM): none D ( PM): meat, starch and vegetable meal OR soup meal with a few crackers, diet soda Snk ( PM): not usually unless peanuts Beverages: diet soda  Usual physical activity: none at this time  Estimated energy needs: 1600 calories 180 g carbohydrates 120 g protein 44 g fat     Intervention: Due to poor Carb Counting skills I instructed him on Carb Counting based on Food groups. Per diet history, he is eating about 60 grams or more for breakfast, 30 grams for lunch and 60 grams for supper. He is comfortable telling the pump 30 grams for a smaller meal and 60 grams for a larger meal, and states he understands that 168 grams is excessive. (I explained to him that since the pump has a Max Bolus setting of 20 units, that has prevented the pump from over dosing his insulin even with the incorrect carb count used.)  We verified the pump settings and corrected the Max Bolus from 10 to 20 units as well as the Cannula Fill from 5.0 units  to 0.5 units. Also instructed him how to give a Correction Bolus for high BG when he is not planning to eat at that time. I encouraged him to give this Bolus to treat high BG's between meals and that this would help bring his A1c down for him.     Pump Settings: Date: Current Date: 08/03/13/15  No changes made today   Basal Rate: Carb Ratio Sensitivity  Basal Rate: Carb Ratio Sensitivity   MN: 2.35 MN: 1/5 1/16 MN: 2.35   MN:1/5  1/16     6 PM: 1/4    6 PM: 1/4                                                  Plan: Use 30 grams for a smaller carb meal and 60 grams for a larger meal Consider giving yourself a correction dose if your BG is high in the middle of the day and you aren't eating a meal at that time. We adjusted your pump settings today for a Max Bolus of 20 units and the Cannula fill is 0.5 units.   Handouts given during visit include: CareLink Pro Reports  Barriers to learning/adherance to lifestyle change: he  is concerned about cost of these visits. I explained Medicare coverage is limited to 2 hours per year so we plan to spread our visits out accordingly.  Diabetes self-care support plan:   [facility] support group  Ongoing diabetes education and insulin pump fine tuning in 4 month increments..  Monitoring/Evaluation:  Dietary intake, exercise, SMBG, and body weight in 4 months.

## 2013-08-10 ENCOUNTER — Encounter: Payer: Self-pay | Admitting: *Deleted

## 2013-12-06 ENCOUNTER — Encounter: Payer: Medicare Other | Attending: Internal Medicine | Admitting: *Deleted

## 2013-12-06 NOTE — Progress Notes (Signed)
Appt start time: 1400 end time:  1430.  Assessment:  Patient was seen on  12/05/13 for individual diabetes education follow up visit. He is here with his wife today. He states he feels the last changes made to his pump settings have been helpful. He also stated he is putting "100" mg/dl into Bolus Wizard instead of his actual BG #. He does not feel that when he puts the actual # in that it makes any difference.  We uploaded his pump to [facility]Link Pro today to review his settings as well as his BG patterns and evaluate his use of the pump. I then discovered that the serial # was different than the last upload. He then  stated he had another problem with his pump and has received another replacement. The date was incorrect, and the year in his current pump was 2012.  Current HbA1c: 8.7%  MEDICATIONS: see list. Diabetes medications are Apidra in pump and recently Victoza has been added   DIETARY INTAKE:  Usual eating pattern includes 2-3 meals and 0-1 snacks per day.  Everyday foods include easily prepared foods.  Avoided foods include regular soda, usually avoids desserts.    24-hr recall:  B ( AM): cereal with milk OR regular oatmeal with fresh fruit, usually 8 oz OJ and 8 oz Skim milk Snk ( AM): none  L ( PM): sandwich Snk ( PM): none D ( PM): meat, starch and vegetable meal OR soup meal with a few crackers, diet soda Snk ( PM): not usually unless peanuts Beverages: diet soda  Usual physical activity: none at this time  Estimated energy needs: 1600 calories 180 g carbohydrates 120 g protein 44 g fat     Intervention:   When I attempted to upload his pump to [facility]Link Pro, I got an error message saying the data was not valid and would be cleared. I called the [employer] Help Line, but the problem was not resolved. So I have corrected the date in the pump and patient is willing to come back in 2 weeks to upload again.  I reviewed the rationale of insulin for his meals (ICR) and  additional insulin to correct hyperglycemia (ISF). He acknowledged the need to give the pump his actual BG information so correction insulin can be delivered.   Due to the inability to get pump data today, we will reschedule for another visit in 2 weeks and with the corrected time and date, we should have better success.     Pump Settings:  12/06/13 - No changes made today  Date: Current Date: 12/06/13/15     Basal Rate: Carb Ratio Sensitivity  Basal Rate: Carb Ratio Sensitivity   MN: 2.35 MN: 1/5 1/16 MN: 2.35   MN:1/5  1/16     6 PM: 1/4    6 PM: 1/4                                                  Plan: Consider telling pump actual BG information so correction insulin can bring high BG's down to target Come back in 2 weeks so we can attempt to upload data again.   Handouts given during visit include: None at this visit  Barriers to learning/adherance to lifestyle change: he is concerned about cost of these visits. I explained Medicare coverage is limited to 2 hours per  year so we plan to spread our visits out accordingly. Since we were not able to accomplish any review of his BG, I will not charge him for this visit. We will reschedule for 2 weeks from today.  Diabetes self-care support plan:   [facility] support group  Ongoing diabetes education and insulin pump fine tuning in 4 month increments..  Monitoring/Evaluation:  Dietary intake, exercise, SMBG, and body weight in 2 weeks.

## 2013-12-25 ENCOUNTER — Encounter: Payer: Medicare Other | Attending: Internal Medicine | Admitting: *Deleted

## 2013-12-25 DIAGNOSIS — Z9641 Presence of insulin pump (external) (internal): Secondary | ICD-10-CM | POA: Insufficient documentation

## 2013-12-25 DIAGNOSIS — Z713 Dietary counseling and surveillance: Secondary | ICD-10-CM | POA: Insufficient documentation

## 2013-12-25 DIAGNOSIS — E119 Type 2 diabetes mellitus without complications: Secondary | ICD-10-CM | POA: Insufficient documentation

## 2013-12-29 NOTE — Progress Notes (Signed)
Appt start time: 1130 end time:  1200.  Assessment:  Patient was seen on  12/25/13 for individual diabetes education follow up visit. Last visit we were not able to upload his pump, believing that it was because it had the incorrect time and date, with the year being 2012. He is returning today to attempt to upload with more data to assess.   Current HbA1c: 8.7%  MEDICATIONS: see list. Diabetes medications are Apidra in pump and recently Victoza has been added   DIETARY INTAKE:  Usual eating pattern includes 2-3 meals and 0-1 snacks per day.  Everyday foods include easily prepared foods.  Avoided foods include regular soda, usually avoids desserts.    24-hr recall:  B ( AM): cereal with milk OR regular oatmeal with fresh fruit, usually 8 oz OJ and 8 oz Skim milk Snk ( AM): none  L ( PM): sandwich Snk ( PM): none D ( PM): meat, starch and vegetable meal OR soup meal with a few crackers, diet soda Snk ( PM): not usually unless peanuts Beverages: diet soda  Usual physical activity: none at this time  Estimated energy needs: 1600 calories 180 g carbohydrates 120 g protein 44 g fat     Intervention:   Again, unable to upload pump, showing incomplete data, so I called [employer] Help Line again. They stated his pump was not working properly and arranged with the patient to have another pump delivered within 24 hours.  I have offered for the patient to come in for the programming of the new pump when it arrives. We will again collect data for a couple of weeks and make adjustments at that time.    Pump Settings:  12/06/13 - No changes made today  Date: Current Date: 12/25/13/15     Basal Rate: Carb Ratio Sensitivity  Basal Rate: Carb Ratio Sensitivity   MN: 2.35 MN: 1/5 1/16 MN: 2.35   MN:1/5  1/16     6 PM: 1/4    6 PM: 1/4                                                  Plan: Consider telling pump actual BG information so correction insulin can bring high BG's down to  target Come back in 2 days so we can program your settings into new pump and then in 2 weeks to upload and get data we need to assess your control.   Handouts given during visit include: None at this visit  Barriers to learning/adherance to lifestyle change: he is concerned about cost of these visits. I explained Medicare coverage is limited to 2 hours per year so we plan to spread our visits out accordingly. Since we were not able to accomplish any review of his BG, I will not charge him for this visit. We will reschedule for 2 days   Diabetes self-care support plan:   [facility] support [facility]  Ongoing diabetes education and insulin pump fine tuning in 4 month increments..  Monitoring/Evaluation:  Dietary intake, exercise, SMBG, and body weight in 2 days.

## 2014-01-17 ENCOUNTER — Encounter: Payer: Medicare Other | Admitting: *Deleted

## 2014-01-17 DIAGNOSIS — E1165 Type 2 diabetes mellitus with hyperglycemia: Secondary | ICD-10-CM

## 2014-01-17 DIAGNOSIS — Z713 Dietary counseling and surveillance: Secondary | ICD-10-CM | POA: Diagnosis not present

## 2014-01-17 DIAGNOSIS — E119 Type 2 diabetes mellitus without complications: Secondary | ICD-10-CM | POA: Diagnosis not present

## 2014-01-17 DIAGNOSIS — IMO0002 Reserved for concepts with insufficient information to code with codable children: Secondary | ICD-10-CM

## 2014-01-17 DIAGNOSIS — E118 Type 2 diabetes mellitus with unspecified complications: Principal | ICD-10-CM

## 2014-01-17 DIAGNOSIS — Z9641 Presence of insulin pump (external) (internal): Secondary | ICD-10-CM | POA: Diagnosis not present

## 2014-01-18 NOTE — Progress Notes (Signed)
Appt start time: 1130 end time:  1200.  Assessment:  Patient was seen on  12/307/15 for individual diabetes education follow up visit. He has now been on the most recent replacement pump and he is returning today to upload with more data to assess.   Current HbA1c: 8.7%  MEDICATIONS: see list. Diabetes medications are Apidra in pump and recently Victoza has been added   DIETARY INTAKE:  Usual eating pattern includes 2-3 meals and 0-1 snacks per day.  Everyday foods include easily prepared foods.  Avoided foods include regular soda, usually avoids desserts.    24-hr recall:  B ( AM): cereal with milk OR regular oatmeal with fresh fruit, usually 8 oz OJ and 8 oz Skim milk Snk ( AM): none  L ( PM): sandwich Snk ( PM): none D ( PM): meat, starch and vegetable meal OR soup meal with a few crackers, diet soda Snk ( PM): not usually unless peanuts Beverages: diet soda  Usual physical activity: none at this time  Estimated energy needs: 1600 calories 180 g carbohydrates 120 g protein 44 g fat    Intervention:   We were successful today in uploading his pump! Reports indicate average BG @ 226 +/- 68 mg/dl. Fasting BG's are too high averaging 229 mg/dl. BG relatively stable the rest of the day, though elevated. I feel if we can get the FBG down, the rest of the day should be OK. Will increase Basal Rate from 2 AM to 8 AM to work on lowering the FBG. Also instructed him on use of the Temp Basal to use either for increased activity or sick days to adjust basal rates for short periods of time.        Pump Settings:  01/17/14   Date: Current Date: 01/17/14  Changes in bold print   Basal Rate: Carb Ratio Sensitivity  Basal Rate: Carb Ratio Sensitivity   MN: 2.35 MN: 1/5 1/16 MN: 2.35   MN:1/5  1/16     6 PM: 1/4  2 AM 2.60 (+) 6 PM: 1/4        8 AM 2.35 (=)                                         Plan: We increased your Basal Rate from 2 AM to 8 AM by 10% as discussed to help  bring down your FBG. Continue to use Bolus Wizard for meal time and correction boluses.   Barriers to learning/adherance to lifestyle change: he is concerned about cost of these visits. I explained Medicare coverage is limited to 3 hours per year so we plan to spread our visits out accordingly.   Diabetes self-care support plan:   [facility] support [facility]  Ongoing diabetes education and insulin pump fine tuning in 3 month increments..  Monitoring/Evaluation:  Dietary intake, exercise, SMBG, and body weight in 3 months.

## 2014-03-22 ENCOUNTER — Encounter: Payer: Medicare Other | Attending: Internal Medicine | Admitting: *Deleted

## 2014-03-22 ENCOUNTER — Encounter: Payer: Self-pay | Admitting: *Deleted

## 2014-03-22 VITALS — Ht 72.0 in | Wt 283.1 lb

## 2014-03-22 DIAGNOSIS — Z9641 Presence of insulin pump (external) (internal): Secondary | ICD-10-CM | POA: Diagnosis not present

## 2014-03-22 DIAGNOSIS — E119 Type 2 diabetes mellitus without complications: Secondary | ICD-10-CM | POA: Insufficient documentation

## 2014-03-22 DIAGNOSIS — E118 Type 2 diabetes mellitus with unspecified complications: Secondary | ICD-10-CM

## 2014-03-22 DIAGNOSIS — Z713 Dietary counseling and surveillance: Secondary | ICD-10-CM | POA: Diagnosis not present

## 2014-03-22 NOTE — Progress Notes (Signed)
Appt start time: 1130 end time:  1200.  Assessment:  Patient was seen on  03/22/2014 for individual diabetes education follow up visit. He has now been on the most recent replacement pump and he is returning today to upload with more data to assess.   Current HbA1c: 8.7%  MEDICATIONS: see list. Diabetes medications are Apidra in pump and recently Victoza has been added   DIETARY INTAKE:  Usual eating pattern includes 2-3 meals and 0-1 snacks per day.  Everyday foods include easily prepared foods.  Avoided foods include regular soda, usually avoids desserts.    24-hr recall:  B ( AM): cereal with milk OR regular oatmeal with fresh fruit, usually 8 oz OJ and 8 oz Skim milk Snk ( AM): none  L ( PM): sandwich Snk ( PM): none D ( PM): meat, starch and vegetable meal OR soup meal with a few crackers, diet soda Snk ( PM): not usually unless peanuts Beverages: diet soda  Usual physical activity: none at this time  Estimated energy needs: 1600 calories 180 g carbohydrates 120 g protein 44 g fat    Intervention:   We uploaded his pump and found the following information:  His 2 week average BG has improved from 226 to 218 +/- 51 mg/dl and FBG have moved lower with increase in Basal at last visit, though not to [employer] yet.  The reports indicate an Override for almost every Bolus each day. He was not aware of any overriding on his part, so with further review I saw that he usually needs more than 25 units at each meal with Correction and Meal insulin combined, and the Max Bolus is 25 units. So he is not getting adequate insulin.    I have suggested he give his Bolus in 2 parts; to give insulin for his BG first and once that is delivered, to go back into the Bolus and give the food insulin separately. Neither of these doses should ever be over 25 units individually so he should receive the accurate amount of insulin and should result in improved BG management. I explained both the rationale  and the steps involved and he expressed good verbal understanding.  I did not make any pump setting changes today, will wait to see the results of the 2-step Bolus and then re-assess.        Pump Settings:03/22/2014   Date: Current Date: 03/22/2014  No changes today   Basal Rate: Carb Ratio Sensitivity  Basal Rate: Carb Ratio Sensitivity   MN: 2.35 MN: 1/5 1/16 MN: 2.35   MN:1/5  1/16   2 A 2.60 6 PM: 1/4  2 AM 2.60  6 PM: 1/4    8 A 2.35   8 AM 2.35                                          Plan: We discovered today that your pump has been over riding your Bolus insulin as the total you need at most meals is over 25 units. Consider giving your Boluses in 2 steps:  Test your BG and enter that into pump, choose 0 for the food at this time, let pump deliver insulin for just the BG  Once completed, Choose "B" button, accept the BG # (ignore it) and enter your grams of food, Activate to deliver that insulin  Consider giving yourself a correction dose  if your BG is high in the middle of the day and you aren't eating a meal at that time.    Barriers to learning/adherance to lifestyle change: he expresses concern over his own aging and what to expect in his future.   Diabetes self-care support plan:   [facility] support group  Ongoing diabetes education and insulin pump fine tuning in 3 month increments..  Monitoring/Evaluation:  Dietary intake, exercise, SMBG, and body weight in 3 months.

## 2014-03-22 NOTE — Patient Instructions (Signed)
Plan: We discovered today that your pump has been over riding your Bolus insulin as the total you need at most meals is over 25 units. Consider giving your Boluses in 2 steps:  Test your BG and enter that into pump, choose 0 for the food at this time, let pump deliver insulin for just the BG  Once completed, Choose "B" button, accept the BG # (ignore it) and enter your grams of food, Activate to deliver that insulin  Consider giving yourself a correction dose if your BG is high in the middle of the day and you aren't eating a meal at that time.

## 2014-06-22 ENCOUNTER — Encounter: Payer: Medicare Other | Attending: Internal Medicine | Admitting: *Deleted

## 2014-06-22 VITALS — Ht 72.0 in | Wt 280.2 lb

## 2014-06-22 DIAGNOSIS — IMO0002 Reserved for concepts with insufficient information to code with codable children: Secondary | ICD-10-CM

## 2014-06-22 DIAGNOSIS — Z9641 Presence of insulin pump (external) (internal): Secondary | ICD-10-CM | POA: Diagnosis not present

## 2014-06-22 DIAGNOSIS — Z713 Dietary counseling and surveillance: Secondary | ICD-10-CM | POA: Diagnosis not present

## 2014-06-22 DIAGNOSIS — E1165 Type 2 diabetes mellitus with hyperglycemia: Secondary | ICD-10-CM

## 2014-06-22 DIAGNOSIS — E119 Type 2 diabetes mellitus without complications: Secondary | ICD-10-CM | POA: Insufficient documentation

## 2014-06-22 DIAGNOSIS — E118 Type 2 diabetes mellitus with unspecified complications: Secondary | ICD-10-CM

## 2014-06-22 NOTE — Patient Instructions (Signed)
Plan: Your average BG has improved since our last visit from 218 to 196 mg/dl! We have determined that based on your daily volume of insulin of 115 units per day you need to change out your site every 2 days and your monthly volume of insulin is 3500 units so you need 15 sets per month and 4 vials of insulin per month. I will contact your MD for these Rx's. I have taught you to use the Easy Bolus so you can supplement your dose as needed when it exceeds the maximum of 25 units. Continue giving yourself a correction dose if your BG is high in the middle of the day and you aren't eating a meal at that time.

## 2014-07-02 NOTE — Progress Notes (Signed)
Appt start time: 1145 end time:  1230.  Assessment:  Patient was seen on  06/22/2014 for individual diabetes education follow up visit. Weight loss of 2 pounds noted since 03/22/14 visit. He states he has not been able to give Bolus in 2 steps after all, so he hits his max bolus of 25 units often and therefore he often does not receive the total insulin required for both food and correction bolus. Plan to upload his pump so I can evaluate his use of the pump today.  Current HbA1c: 8.7%  MEDICATIONS: see list. Diabetes medications are Apidra in pump and recently Victoza has been added   DIETARY INTAKE:  Usual eating pattern includes 2-3 meals and 0-1 snacks per day.  Everyday foods include easily prepared foods.  Avoided foods include regular soda, usually avoids desserts.    24-hr recall:  B ( AM): cereal with milk OR regular oatmeal with fresh fruit, usually 8 oz OJ and 8 oz Skim milk Snk ( AM): none  L ( PM): sandwich Snk ( PM): none D ( PM): meat, starch and vegetable meal OR soup meal with a few crackers, diet soda Snk ( PM): not usually unless peanuts Beverages: diet soda  Usual physical activity: none at this time  Estimated energy needs: 1600 calories 180 g carbohydrates 120 g protein 44 g fat    Intervention:   We uploaded his pump and found the following information:  His 2 week average BG has again improved, now from  218 to 196 +/- 49 mg/dl and his BG are becoming more stable, fewer excursions.  The reports indicate he is changing out his reservoir every 2 days due to Total Daily Doses of about 115 units per day. He will need a Rx from MD to pump supply company regarding this so he will get 15 reservoirs and infusion sets per month instead of the standard 10 to accommodate this amount of insulin.    I taught him how to give an Easy Bolus set in 1.0 unit increments so he can observe the total bolus needed on Bolus Wizard and provide the difference via the Easy Bolus. We  practiced several times and he stated he felt comfortable with this method of Bolus delivery for boluses greater than 25 units.   I did not make any pump setting changes today, will wait to see the results of the Easy Bolus in addition to Bolus Wizard and then re-assess.          Pump Settings:03/22/2014   Date: Current Date: 06/22/2014  No changes today   Basal Rate: Carb Ratio Sensitivity  Basal Rate: Carb Ratio Sensitivity   MN: 2.35 MN: 1/5 1/16 MN: 2.35   MN:1/5  1/16   2 A 2.60 6 PM: 1/4  2 AM 2.60  6 PM: 1/4    8 A 2.35   8 AM 2.35                                          Plan: Your average BG has improved since our last visit from 218 to 196 mg/dl! We have determined that based on your daily volume of insulin of 115 units per day you need to change out your site every 2 days and your monthly volume of insulin is 3500 units so you need 15 reservoirs and infusion sets per month and 4 vials  of insulin per month. I will contact your MD for these Rx's. I have taught you to use the Easy Bolus so you can supplement your dose as needed when it exceeds the maximum of 25 units. Continue giving yourself a correction dose if your BG is high in the middle of the day and you aren't eating a meal at that time.    Barriers to learning/adherance to lifestyle change: he expresses concern over his own aging and what to expect in his future.   Diabetes self-care support plan:   [facility] support group  Ongoing diabetes education and insulin pump fine tuning in 3 month increments..  Monitoring/Evaluation:  Dietary intake, exercise, SMBG, and body weight in 3 months.

## 2014-09-21 ENCOUNTER — Encounter: Payer: Self-pay | Admitting: *Deleted

## 2014-09-21 ENCOUNTER — Encounter: Payer: Medicare Other | Attending: Internal Medicine | Admitting: *Deleted

## 2014-09-21 VITALS — Ht 72.0 in | Wt 282.0 lb

## 2014-09-21 DIAGNOSIS — Z9641 Presence of insulin pump (external) (internal): Secondary | ICD-10-CM | POA: Insufficient documentation

## 2014-09-21 DIAGNOSIS — E119 Type 2 diabetes mellitus without complications: Secondary | ICD-10-CM | POA: Insufficient documentation

## 2014-09-21 DIAGNOSIS — E1165 Type 2 diabetes mellitus with hyperglycemia: Secondary | ICD-10-CM

## 2014-09-21 DIAGNOSIS — Z713 Dietary counseling and surveillance: Secondary | ICD-10-CM | POA: Insufficient documentation

## 2014-09-21 DIAGNOSIS — E118 Type 2 diabetes mellitus with unspecified complications: Secondary | ICD-10-CM

## 2014-09-21 DIAGNOSIS — IMO0002 Reserved for concepts with insufficient information to code with codable children: Secondary | ICD-10-CM

## 2014-09-21 NOTE — Progress Notes (Signed)
Appt start time: 0930 end time:  1000.  Assessment:  Patient was seen on  09/20/2014 for individual diabetes education follow up visit. Weight stable since last visit in June. He states he continues to struggle with giving his Bolus accurately since it often exceeds the maximum the pump will deliver of 25 units. So he often does not receive the total insulin required for both food and correction bolus. Plan to upload his pump so I can evaluate his use of the pump today.  Current HbA1c: 8.7%  MEDICATIONS: see list. Diabetes medications are Humalog in pump and Victoza    DIETARY INTAKE:  Usual eating pattern includes 2-3 meals and 0-1 snacks per day.  Everyday foods include easily prepared foods.  Avoided foods include regular soda, usually avoids desserts.    24-hr recall:  B ( AM): cereal with milk OR regular oatmeal with fresh fruit, usually 8 oz OJ and 8 oz Skim milk Snk ( AM): none  L ( PM): sandwich Snk ( PM): none D ( PM): meat, starch and vegetable meal OR soup meal with a few crackers, diet soda Snk ( PM): not usually unless peanuts Beverages: diet soda  Usual physical activity: none at this time  Estimated energy needs: 1600 calories 180 g carbohydrates 120 g protein 44 g fat    Intervention:   We uploaded his pump and found the following information:     His 2 week average BG is stable at 199 +/- 53 mg/dl and his BG are more stable, fewer excursions.  The reports indicate he is changing out his reservoir every 2 days due to Total Daily Doses of about 115 units per day. He will need a Rx from MD to pump supply company regarding this so he will get 15 reservoirs and infusion sets per month instead of the standard 10 to accommodate this amount of insulin.    We reviewed giving his Bolus in 2 steps when his BG is above 200 mg/dl so he can deliver the correction insulin first and then bolus for food separately so the total is less than the max bolus of 25 units. I provided  written instructions which he verbalized understanding of before he left. .   I did not make any pump setting changes today.          Pump Settings:09/20/2014   Date: Current Date: 09/20/2014  No changes today   Basal Rate: Carb Ratio Sensitivity  Basal Rate: Carb Ratio Sensitivity   MN: 2.35 MN: 1/5 1/16 MN: 2.35   MN:1/5  1/16   2 A 2.60 6 PM: 1/4  2 AM 2.60  6 PM: 1/4    8 A 2.35   8 AM 2.35                                          Plan: Plan: We practiced your giving your meal and correction insulin in 2 steps today which should help you get a more accurate insulin dose and improve your BG's.    Barriers to learning/adherance to lifestyle change: he has expressed concern over his own aging and what to expect in his future.   Diabetes self-care support plan:   [facility] support group  Ongoing diabetes education and insulin pump fine tuning in 3 month increments..  Monitoring/Evaluation:  Dietary intake, exercise, SMBG, and body weight in 3 months.

## 2014-09-21 NOTE — Patient Instructions (Signed)
Plan: We practiced your giving your meal and correction insulin in 2 steps today which should help you get a more accurate insulin dose and improve your BG's.

## 2014-09-28 ENCOUNTER — Ambulatory Visit: Payer: PRIVATE HEALTH INSURANCE | Admitting: *Deleted

## 2014-12-21 ENCOUNTER — Encounter: Payer: Medicare Other | Attending: Internal Medicine | Admitting: *Deleted

## 2014-12-21 VITALS — Ht 72.0 in | Wt 273.7 lb

## 2014-12-21 DIAGNOSIS — IMO0002 Reserved for concepts with insufficient information to code with codable children: Secondary | ICD-10-CM

## 2014-12-21 DIAGNOSIS — E119 Type 2 diabetes mellitus without complications: Secondary | ICD-10-CM | POA: Diagnosis present

## 2014-12-21 DIAGNOSIS — E118 Type 2 diabetes mellitus with unspecified complications: Secondary | ICD-10-CM

## 2014-12-21 DIAGNOSIS — E1165 Type 2 diabetes mellitus with hyperglycemia: Secondary | ICD-10-CM

## 2014-12-21 DIAGNOSIS — Z713 Dietary counseling and surveillance: Secondary | ICD-10-CM | POA: Diagnosis not present

## 2014-12-21 DIAGNOSIS — Z9641 Presence of insulin pump (external) (internal): Secondary | ICD-10-CM | POA: Diagnosis not present

## 2014-12-21 DIAGNOSIS — Z794 Long term (current) use of insulin: Secondary | ICD-10-CM

## 2014-12-21 NOTE — Patient Instructions (Signed)
Plan: Continue giving your meal and correction insulin in 2 steps which has helped you get a more accurate insulin dose and improve your BG's. Due to your morning BG being in the 200's, we discussed the value of checking your BG before bedtime and giving a bolus to bring that number down and should improve your morning BG too

## 2014-12-28 ENCOUNTER — Telehealth: Payer: Self-pay | Admitting: *Deleted

## 2014-12-28 ENCOUNTER — Encounter: Payer: Self-pay | Admitting: *Deleted

## 2014-12-28 NOTE — Progress Notes (Signed)
Appt start time: [id number] end time:  1230.  Assessment:  Patient was seen on  12/21/2014 for individual diabetes education follow up visit. Weight loss of 9 pounds noted since last visit in September! He states he is now giving his Bolus accurately since it often exceeds the maximum the pump will deliver of 25 units. If his BG is above 200 mg/dl, he only gives the Correction dose and puts in "0" for his food initially so he receives his correction dose. Once that is delivered, he goes back into the Bolus Wizard and puts in the grams of carbohydrate so he receives the insulin for that meal. Neither the correction or meal insulin is above the maximum limit of 25 so he is getting a more accurate dose of insulin now. Plan to upload his pump so I can evaluate his use of the pump today.  Current HbA1c: 8.7%  MEDICATIONS: see list. Diabetes medications are Humalog in pump and Victoza    DIETARY INTAKE:  Usual eating pattern includes 2-3 meals and 0-1 snacks per day.  Everyday foods include easily prepared foods.  Avoided foods include regular soda, usually avoids desserts.    24-hr recall:  B ( AM): cereal with milk OR regular oatmeal with fresh fruit, usually 8 oz OJ and 8 oz Skim milk Snk ( AM): none  L ( PM): sandwich Snk ( PM): none D ( PM): meat, starch and vegetable meal OR soup meal with a few crackers, diet soda Snk ( PM): not usually unless peanuts Beverages: diet soda  Usual physical activity: none at this time  Estimated energy needs: 1600 calories 180 g carbohydrates 120 g protein 44 g fat    Intervention:   We uploaded his pump and found the following information:      His 2 week average BG is higher this visit at 240 +/- 71 mg/dl but there are a couple of excursions that brought the average up more than usual. Overall, it appears he is stable around 200 with a gradual rise overnight..  The reports indicate he is changing out his reservoir every 2 days due to Total Daily  Doses of about 115 units per day. He will need a Rx from MD to pump supply company regarding this so he will get 15 reservoirs and infusion sets per month instead of the standard 10 to accommodate this amount of insulin. His insurance requires a Rx to cover this expense.   He plans to coninue giving his Bolus in 2 steps when his BG is above 200 mg/dl so he can deliver the correction insulin first and then bolus for food separately so the total is less than the max bolus of 25 units. HE has written instructions from previous visit which he verbalized understanding of before he left. .   Due to the rise in BG overnight, he offered to check his BG at bedtime now and to give a correction dose to bing that BG down prior to sleep time, which I strongly agree with.   No changes to pump settings today  Pump Settings:09/20/2014   Date: Current Date: 12/21/2014  No changes today   Basal Rate: Carb Ratio Sensitivity  Basal Rate: Carb Ratio Sensitivity   MN: 2.35 MN: 1/5 1/16 MN: 2.35   MN:1/5  1/16   2 A 2.60 6 PM: 1/4  2 AM 2.60  6 PM: 1/4    8 A 2.35   8 AM 2.35  Plan: Continue giving your meal and correction insulin in 2 steps, which should help you get a more accurate insulin dose and improve your BG's.   Start checking you BG at bedtime so you can give a correction bolus to bring your BG down overnight and improve your fasting BG.   Barriers to learning/adherance to lifestyle change: he has expressed concern over his own aging and what to expect in his future.   Diabetes self-care support plan:   [facility] support group  Ongoing diabetes education and insulin pump fine tuning in 3 month increments..  Monitoring/Evaluation:  Dietary intake, exercise, SMBG, and body weight in 3 months.

## 2014-12-28 NOTE — Telephone Encounter (Deleted)
MR. Rudy JewJames A. Hitchman is changing out his insulin pump reservoir every 2 days due to Total Daily Doses of about 115 units per day. He will need a Rx from MD to pump supply company regarding this so he will get 15 reservoirs and infusion sets per month instead of the standard 10 to accommodate this amount of insulin. His insurance requires a Rx to cover this expense.  If you have any questions or concerns, please let me know.

## 2015-03-21 ENCOUNTER — Encounter: Payer: Medicare Other | Attending: Internal Medicine | Admitting: *Deleted

## 2015-03-21 DIAGNOSIS — E119 Type 2 diabetes mellitus without complications: Secondary | ICD-10-CM | POA: Insufficient documentation

## 2015-03-21 DIAGNOSIS — Z9641 Presence of insulin pump (external) (internal): Secondary | ICD-10-CM | POA: Diagnosis not present

## 2015-03-21 DIAGNOSIS — Z713 Dietary counseling and surveillance: Secondary | ICD-10-CM | POA: Insufficient documentation

## 2015-03-28 ENCOUNTER — Encounter: Payer: Self-pay | Admitting: *Deleted

## 2015-03-28 NOTE — Progress Notes (Signed)
Appt start time: 1430 end time:  1530.  Assessment:  Patient was seen on  03/21/15 for individual diabetes education follow up visit. Weight gain of 8 pounds of 8 pounds noted, patient states he has Stage 4 Renal Disease with water retention. He states he is still giving his Bolus accurately; If his BG is above 200 mg/dl, he only gives the Correction dose and puts in "0" for his food initially so he receives his correction dose. Once that is delivered, he goes back into the Bolus Wizard and puts in the grams of carbohydrate so he receives the insulin for that meal. Neither the correction or meal insulin is above the maximum limit of 25 so he is getting a more accurate dose of insulin now. Plan to upload his pump so I can evaluate his use of the pump today.  Current HbA1c: 8.7%  MEDICATIONS: see list. Diabetes medications are Humalog in pump and Victoza    DIETARY INTAKE:  Usual eating pattern includes 2-3 meals and 0-1 snacks per day.  Everyday foods include easily prepared foods.  Avoided foods include regular soda, usually avoids desserts.    24-hr recall:  B ( AM): cereal with milk OR regular oatmeal with fresh fruit, usually 8 oz OJ and 8 oz Skim milk Snk ( AM): none  L ( PM): sandwich Snk ( PM): none D ( PM): meat, starch and vegetable meal OR soup meal with a few crackers, diet soda Snk ( PM): not usually unless peanuts Beverages: diet soda  Usual physical activity: none at this time  Estimated energy needs: 1600 calories 180 g carbohydrates 120 g protein 44 g fat    Intervention:   We uploaded his pump and found the following information:        His 2 week average BG is much better this visit at 122 +/- 40 mg/dl  Overall, it appears he is very stable during the day between 70 & 140 with an occasional low BG in the afternoon  The reports indicate he is changing out his reservoir every 2 days due to Total Daily Doses of about 115 units per day. He will need a Rx from MD  to pump supply company regarding this so he will get 15 reservoirs and infusion sets per month instead of the standard 10 to accommodate this amount of insulin. His insurance requires a Rx to cover this expense.   He plans to coninue giving his Bolus in 2 steps when his BG is above 200 mg/dl so he can deliver the correction insulin first and then bolus for food separately so the total is less than the max bolus of 25 units. HE has written instructions from previous visit which he verbalized understanding of before he left. .   Due to the drop in BG overall and risk of hypoglycemia with increased renal disease, I plan to decrease his basal rates by 10%. I also asked him to bolus for his snack in the afternoons to help with the rise in his BG before supper time. He agreed.  Pump Settings:09/20/2014   Date: Current Date: 3/2/201  Changes in bold print   Basal Rate: Carb Ratio Sensitivity  Basal Rate: Carb Ratio Sensitivity   MN: 2.35 MN: 1/5 1/16 MN: 2.15  (-) MN:1/5  1/16   2 A 2.60 6 PM: 1/4  2 AM 2.35   (-) 6 PM: 1/4    8 A 2.35   8 AM 2.15   (-)  Plan: Continue giving your meal and correction insulin in 2 steps, which should help you get a more accurate insulin dose and improve your BG's.   Consider giving a bolus for your afternoon snack to improve your BG before supper time.   Barriers to learning/adherance to lifestyle change: he has expressed concern over his own aging and what to expect in his future.   Diabetes self-care support plan:   [facility] support [facility]  Ongoing diabetes education and insulin pump fine tuning in 3 month increments..  Monitoring/Evaluation:  Dietary intake, exercise, SMBG, and body weight in 3 months.

## 2015-06-20 ENCOUNTER — Ambulatory Visit: Payer: Managed Care, Other (non HMO) | Admitting: *Deleted

## 2015-06-21 ENCOUNTER — Encounter: Payer: Self-pay | Admitting: *Deleted

## 2015-06-21 ENCOUNTER — Encounter: Payer: Medicare Other | Attending: Internal Medicine | Admitting: *Deleted

## 2015-06-21 VITALS — Ht 72.0 in | Wt 263.2 lb

## 2015-06-21 DIAGNOSIS — E119 Type 2 diabetes mellitus without complications: Secondary | ICD-10-CM | POA: Insufficient documentation

## 2015-06-21 DIAGNOSIS — Z9641 Presence of insulin pump (external) (internal): Secondary | ICD-10-CM | POA: Insufficient documentation

## 2015-06-21 DIAGNOSIS — E118 Type 2 diabetes mellitus with unspecified complications: Secondary | ICD-10-CM

## 2015-06-21 DIAGNOSIS — Z713 Dietary counseling and surveillance: Secondary | ICD-10-CM | POA: Diagnosis not present

## 2015-06-21 NOTE — Progress Notes (Signed)
Appt start time: 1030 end time:  1100.  Assessment:  Patient was seen on  06/21/15 for individual diabetes education follow up visit. Weight loss of 8 pounds of 18 pounds noted, patient states he has Stage 4 Renal Disease with more water retention at last visit and that is much improved today.  He is getting a more accurate dose of insulin now by delivering his Correction Dose first if his BG is above 200 mg/dl and then delivering his food bolus after the correction has been delivered. But he has very few BG above 200 anymore!. Plan to upload his pump so I can evaluate his use of the pump today.  Current HbA1c:03/21/15 was 8.7%, now down to 7.9% on 05/30/2015!  MEDICATIONS: see list. Diabetes medications are Humalog in pump and Victoza    DIETARY INTAKE:  Usual eating pattern includes 2-3 meals and 0-1 snacks per day.  Everyday foods include easily prepared foods.  Avoided foods include regular soda, usually avoids desserts.    24-hr recall:  B ( AM): cereal with milk OR regular oatmeal with fresh fruit, usually 8 oz OJ and 8 oz Skim milk Snk ( AM): none  L ( PM): sandwich Snk ( PM): none D ( PM): meat, starch and vegetable meal OR soup meal with a few crackers, diet soda Snk ( PM): not usually unless peanuts Beverages: diet soda  Usual physical activity: none at this time  Estimated energy needs: 1600 calories 180 g carbohydrates 120 g protein 44 g fat    Intervention:   We uploaded his pump and found the following information:       His 2 week average BG is much safer this visit at 166 +/- 57 mg/dl  Overall, it appears he continues to be stable during the day between 120 & 180 and no further low BG's. .   Due to his risk of hypoglycemia with increased renal disease, I plan to make no changes to his pump settings today and feel keeping him at an average of 150-170 mg/dl is in his best interest. He stated that he agreed.  Pump Settings:06/21/15  Date: Current Date: 6/2/201  No  changes today   Basal Rate: Carb Ratio Sensitivity  Basal Rate: Carb Ratio Sensitivity   MN: 2.15 MN: 1/5 1/16 MN: 2.15   MN:1/5  1/16   2 A 2.35 6 PM: 1/4  2 AM 2.35    6 PM: 1/4    8 A 2.15   8 AM 2.15                                            Plan: Continue giving your meal and correction insulin in 2 steps when BG is above 200 mg/dl, which should help you get a more accurate insulin dose and control your BG's.   Continue giving a bolus for your afternoon snack to improve your BG before supper time.   Barriers to learning/adherance to lifestyle change: he has expressed concern over his own aging and what to expect in his future.   Diabetes self[facility]care support [facility]:   [facility]  Ongoing diabetes education and insulin pump fine tuning in 3 month increments..  Monitoring/Evaluation:  Dietary intake, exercise, SMBG, and body weight in 3 months.

## 2015-09-27 ENCOUNTER — Encounter: Payer: Medicare Other | Attending: Internal Medicine | Admitting: *Deleted

## 2015-09-27 DIAGNOSIS — IMO0002 Reserved for concepts with insufficient information to code with codable children: Secondary | ICD-10-CM

## 2015-09-27 DIAGNOSIS — E1165 Type 2 diabetes mellitus with hyperglycemia: Secondary | ICD-10-CM

## 2015-09-27 DIAGNOSIS — Z794 Long term (current) use of insulin: Secondary | ICD-10-CM

## 2015-09-27 DIAGNOSIS — Z9641 Presence of insulin pump (external) (internal): Secondary | ICD-10-CM | POA: Diagnosis not present

## 2015-09-27 DIAGNOSIS — Z713 Dietary counseling and surveillance: Secondary | ICD-10-CM | POA: Diagnosis not present

## 2015-09-27 DIAGNOSIS — E119 Type 2 diabetes mellitus without complications: Secondary | ICD-10-CM | POA: Insufficient documentation

## 2015-09-27 DIAGNOSIS — E118 Type 2 diabetes mellitus with unspecified complications: Secondary | ICD-10-CM

## 2015-09-27 NOTE — Patient Instructions (Signed)
Plan: We have decreased your basal rates from just over 2.0 units per hour to 1.5 units per hour We have decreased your bolus insulin as follows:  Meal insulin from 1 u/5 grams to 1 u / 7 grams carbohydrate  Correction insulin from 1 u / 16 mg/dl to 1 u / 24 mg/dl We have increased your Target BG from 110 to 125 mg/dl Supplements like Carnation Essentials powder can provide the nutrition of a meal or snack. You can use 2% milk instead of skim to increase the calories a little bit Continue giving your meal and correction insulin in 2 steps which has helped you get a more accurate insulin dose and improve your BG's. Call me if you have any questions or concerns.

## 2015-09-27 NOTE — Progress Notes (Signed)
Appt start time: 1100 end time:  1130.  Assessment:  Patient was seen on  09/27/15 for individual diabetes education follow up visit. He is here with his wife, Tobi Bastos. Weight loss of over 24 pounds noted. 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 He is unable to eat normally, limited to soft or more liquid foods. We will upload his insulin pump today and assess his insulin needs with this significant weight loss.   Current HbA1c: 05/30/2015: 7.9%   MEDICATIONS: see list. Diabetes medications are Humalog in pump and Victoza    DIETARY INTAKE:  Usual eating pattern includes 2-3 meals and 0-1 snacks per day.  Everyday foods include easily prepared and swallowed foods.  Avoided foods include most meats or vegetables that could be hard to swallow. regular soda, usually avoids desserts.    24-hr recall:  B ( AM): cereal with milk OR regular oatmeal with fresh fruit, usually 8 oz OJ and 8 oz Skim milk Snk ( AM): none  L ( PM): sandwich Snk ( PM): none D ( PM): soft meat, starch and cooked vegetable meal OR soup meal with a few crackers, diet soda Snk ( PM): not usually  Beverages: diet soda  Usual physical activity: none at this time  Estimated energy needs: 1600 calories 180 g carbohydrates 120 g protein 44 g fat    Intervention:   We uploaded his pump and found the following information:       His 2 week average BG is 125 +/- 49 mg/dl  He is having more low BG's mid morning which is a concern. Due to his risk of hypoglycemia with increased renal disease, I plan to make several changes to his pump settings today (see below) and feel keeping him at an average of 150-170 mg/dl is in his best interest. He stated that he agreed.  Pump Settings:09/27/15  I increased his Target BG from 110 to 125 today Date: Current Date: 9/8/201  Changes in bold below   Basal Rate: Carb Ratio Sensitivity  Basal Rate: Carb Ratio Sensitivity   MN: 2.15  MN: 1/5 1/16 MN: 1.50  (-) MN: 1/7 (-) 1/24  (-)  2 A 2.35 6 PM: 1/4  2 AM 1.50  (-) 6 PM: 1/7    8 A 2.15   8 AM 1.50  (-)                                         Plan: We have decreased your basal rates from just over 2.0 units per hour to 1.5 units per hour We have decreased your bolus insulin as follows:  Meal insulin from 1 u/5 grams to 1 u / 7 grams carbohydrate  Correction insulin from 1 u / 16 mg/dl to 1 u / 24 mg/dl We have increased your Target BG from 110 to 125 mg/dl Supplements like Carnation Essentials powder can provide the nutrition of a meal or snack. You can use 2% milk instead of skim to increase the calories a little bit Continue giving your meal and correction insulin in 2 steps which has helped you get a more accurate insulin dose and improve your BG's. Call me if you have any questions or concerns.  Barriers to learning/adherance to lifestyle change: he has expressed concern over his own aging and what to expect in his  future.   Diabetes self-care support plan:   [facility]  Ongoing diabetes education and insulin pump fine tuning in 3 month increments..  Monitoring/Evaluation:  Dietary intake, exercise, SMBG, and body weight in 3 months.

## 2015-10-20 DEATH — deceased
